# Patient Record
Sex: Male | Born: 1940 | Race: Black or African American | Hispanic: No | Marital: Single | Smoking: Former smoker
Health system: Southern US, Community
[De-identification: ages and names within clinical notes are randomized; demographics above are authoritative.]

## PROBLEM LIST (undated history)

## (undated) DIAGNOSIS — I2510 Atherosclerotic heart disease of native coronary artery without angina pectoris: Secondary | ICD-10-CM

## (undated) DIAGNOSIS — E119 Type 2 diabetes mellitus without complications: Secondary | ICD-10-CM

## (undated) DIAGNOSIS — I350 Nonrheumatic aortic (valve) stenosis: Secondary | ICD-10-CM

## (undated) DIAGNOSIS — I10 Essential (primary) hypertension: Secondary | ICD-10-CM

## (undated) HISTORY — DX: Essential (primary) hypertension: I10

## (undated) HISTORY — DX: Atherosclerotic heart disease of native coronary artery without angina pectoris: I25.10

## (undated) HISTORY — DX: Type 2 diabetes mellitus without complications: E11.9

## (undated) HISTORY — DX: Nonrheumatic aortic (valve) stenosis: I35.0

---

## 2000-07-01 ENCOUNTER — Emergency Department (HOSPITAL_COMMUNITY): Admission: EM | Admit: 2000-07-01 | Discharge: 2000-07-01 | Payer: Self-pay | Admitting: Emergency Medicine

## 2007-09-01 ENCOUNTER — Encounter: Admission: RE | Admit: 2007-09-01 | Discharge: 2007-09-01 | Payer: Self-pay | Admitting: Cardiovascular Disease

## 2009-09-18 ENCOUNTER — Emergency Department (HOSPITAL_COMMUNITY): Admission: EM | Admit: 2009-09-18 | Discharge: 2009-09-18 | Payer: Self-pay | Admitting: Family Medicine

## 2010-08-08 ENCOUNTER — Encounter (INDEPENDENT_AMBULATORY_CARE_PROVIDER_SITE_OTHER): Payer: Self-pay | Admitting: *Deleted

## 2011-01-29 NOTE — Letter (Signed)
Summary: Colonoscopy Date Change Letter  [facility]  [address] [place], [place] [address]   Phone: [phone]  Fax: [phone]      August 08, 2010 MRN: [id number]   Christopher Sawyer [address] [place], [place]  [address]   Dear [name],   Previously you were recommended to have a repeat colonoscopy around this time. Your chart was recently reviewed by Dr. Judie Petit T. Russella Dar of [facility]. Follow up colonoscopy is now recommended in September 2014. This revised recommendation is based on current, nationally recognized guidelines for colorectal cancer screening and polyp surveillance. These guidelines are endorsed by the American Cancer Society, [employer] on Colorectal Cancer as well as numerous other major medical organizations.  Please understand that our recommendation assumes that you do not have any new symptoms such as bleeding, a change in bowel habits, anemia, or significant abdominal discomfort. If you do have any concerning GI symptoms or want to discuss the guideline recommendations, please call to arrange an office visit at your earliest convenience. Otherwise we will keep you in our reminder system and contact you 1-2 months prior to the date listed above to schedule your next colonoscopy.  Thank you,  Judie Petit T. Russella Dar, M.D.  [facility] [phone]

## 2013-07-26 ENCOUNTER — Encounter: Payer: Self-pay | Admitting: Gastroenterology

## 2015-02-19 ENCOUNTER — Encounter: Payer: Self-pay | Admitting: Gastroenterology

## 2017-05-03 ENCOUNTER — Other Ambulatory Visit: Payer: Self-pay

## 2017-05-03 DIAGNOSIS — R0602 Shortness of breath: Secondary | ICD-10-CM

## 2017-05-07 ENCOUNTER — Other Ambulatory Visit: Payer: Self-pay

## 2017-05-07 ENCOUNTER — Ambulatory Visit (HOSPITAL_COMMUNITY): Payer: Medicare Other | Attending: Cardiology

## 2017-05-07 DIAGNOSIS — I350 Nonrheumatic aortic (valve) stenosis: Secondary | ICD-10-CM | POA: Diagnosis not present

## 2017-05-07 DIAGNOSIS — Z87898 Personal history of other specified conditions: Secondary | ICD-10-CM | POA: Diagnosis not present

## 2017-05-07 DIAGNOSIS — I10 Essential (primary) hypertension: Secondary | ICD-10-CM | POA: Insufficient documentation

## 2017-05-07 DIAGNOSIS — R0602 Shortness of breath: Secondary | ICD-10-CM

## 2017-05-17 ENCOUNTER — Ambulatory Visit (INDEPENDENT_AMBULATORY_CARE_PROVIDER_SITE_OTHER): Payer: Medicare Other | Admitting: Cardiovascular Disease

## 2017-05-17 ENCOUNTER — Encounter: Payer: Self-pay | Admitting: Cardiovascular Disease

## 2017-05-17 VITALS — BP 140/80 | HR 78 | Ht 67.5 in | Wt 207.8 lb

## 2017-05-17 DIAGNOSIS — I10 Essential (primary) hypertension: Secondary | ICD-10-CM | POA: Diagnosis not present

## 2017-05-17 DIAGNOSIS — I350 Nonrheumatic aortic (valve) stenosis: Secondary | ICD-10-CM | POA: Diagnosis not present

## 2017-05-17 DIAGNOSIS — E119 Type 2 diabetes mellitus without complications: Secondary | ICD-10-CM | POA: Insufficient documentation

## 2017-05-17 NOTE — Patient Instructions (Addendum)
Medication Instructions:  Your physician recommends that you continue on your current medications as directed. Please refer to the Current Medication list given to you today.  Labwork: No new orders.   Testing/Procedures: Your physician has requested that you have a GATED cardiac CT. Cardiac computed tomography (CT) is a painless test that uses an x-ray machine to take clear, detailed pictures of your heart. For further information please visit [email or web address].   Pre-procedure instructions: [facility] Entrance A on Friday, May 21, 2017 at 9:00 AM No solid foods, caffeine or smoking 4 hours prior to CT. No herbal supplements by mouth 24 hours prior to CT. No sexual enhancement drugs 72 hours prior to CT. Do NOT take Metformin the day before, morning of or 48 hours after CT.    Follow-Up: You have been referred to [facility] of Aortic Stenosis. Dr Orvan Julywen's office will contact you with an appointment.    Any Other Special Instructions Will Be Listed Below (If Applicable).     If you need a refill on your cardiac medications before your next appointment, please call your pharmacy.

## 2017-05-17 NOTE — Progress Notes (Signed)
Cardiology Office Note Date:  05/17/2017   ID:  Christopher PortsWilliam R Blando, DOB [birth year], MRN [id number]  PCP:  Orpah CobbKadakia, Ajay, MD  Cardiologist:  Tonny Bollmanooper, Baldo Hufnagle, MD    Chief Complaint  Patient presents with  . TAVR consult    History of Present Illness: Christopher Sawyer is a 76 y.o. male who presents for evaluation of severe aortic stenosis.   The patient has been followed by Dr Algie CofferKadakia. he's had a heart murmur for approximately 10 years based on his reported history to me. Over the last 6-12 months he has become progressively short of breath. He's been diagnosed with severe aortic stenosis. His evaluation has been through the [facility] system. At the time of today's visit, I don't have records available for review. The patient tells me had a heart catheterization at the [facility] last year. He's been told he needs aortic valve replacement, but he has declined conventional heart surgery. He presents today to see if he might be a candidate for TAVR.  The patient is here alone today. He reports progressive symptoms of shortness of breath with exertion and chest tightness associated with physical exertion. Symptoms occur predictably with walking and incline or at a brisk pace. Occasionally he has symptoms with lower level activity such as walking around the grocery store. Symptoms are relieved with rest. He denies lightheadedness or presyncope. He denies orthopnea or PND. The patient does complain of mild leg swelling. He has not had any resting symptoms. He has no history of lung disease. He smoked for a few years in his 4820s but not since then. 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   Past Medical History:  Diagnosis Date  . Hypertension   . Type 2 diabetes mellitus (HCC)     No past surgical history on file.  Current Outpatient Prescriptions  Medication Sig Dispense Refill  . amLODipine-benazepril (LOTREL) 10-40 MG capsule Take 1 capsule by mouth daily.    Marland Kitchen. aspirin 81  MG EC tablet Take 81 mg by mouth daily.    . metFORMIN (GLUCOPHAGE) 1000 MG tablet Take 1,000 mg by mouth daily.     No current facility-administered medications for this visit.     Allergies:   Penicillins   Social History:  The patient  reports that he has quit smoking. He has never used smokeless tobacco. Smoked for 2 years in his 3120's. Social alcohol.   Family History:  The patient's  family history includes Other in his father and mother.   ROS:  Please see the history of present illness.  Otherwise, review of systems is positive for Shortness of breath, leg swelling.  All other systems are reviewed and negative.   PHYSICAL EXAM: VS:  BP 140/80   Pulse 78   Ht 5' 7.5" (1.715 m)   Wt 207 lb 12.8 oz (94.3 kg)   BMI 32.07 kg/m  , BMI Body mass index is 32.07 kg/m. GEN: Well nourished, well developed, in no acute distress  HEENT: normal  Neck: no JVD, no masses. No carotid bruits Cardiac: RRR with grade 3/6 harsh late peaking systolic murmur with absent A2          Respiratory:  clear to auscultation bilaterally, normal work of breathing GI: soft, nontender, nondistended, + BS MS: no deformity or atrophy  Ext: 1+ pretibial edema, pedal pulses 2+= bilaterally Skin: warm and dry, no rash Neuro:  Strength and sensation are intact Psych: euthymic mood, full affect  EKG:  EKG is ordered today. The ekg ordered today shows normal sinus rhythm 78 bpm, PACs, otherwise within normal limits.  Recent Labs: No results found for requested labs within last 8760 hours.   Lipid Panel  No results found for: CHOL, TRIG, HDL, CHOLHDL, VLDL, LDLCALC, LDLDIRECT    Wt Readings from Last 3 Encounters:  05/17/17 207 lb 12.8 oz (94.3 kg)     Cardiac Studies Reviewed: 2D Echo 05-07-2017: Left ventricle:  The cavity size was normal. There was moderate concentric hypertrophy. Systolic function was normal. The estimated ejection fraction was in the range of 60% to 65%. Wall motion was normal;  there were no regional wall motion abnormalities. Doppler parameters are consistent with abnormal left ventricular relaxation (grade 1 diastolic dysfunction). Doppler parameters are consistent with high ventricular filling pressure.  ------------------------------------------------------------------- Aortic valve:   Trileaflet; severely thickened, severely calcified leaflets. Valve mobility was restricted.  Doppler:   There was severe stenosis.   There was trivial regurgitation.    VTI ratio of LVOT to aortic valve: 0.17. Valve area (VTI): 0.75 cm^2. Indexed valve area (VTI): 0.35 cm^2/m^2. Peak velocity ratio of LVOT to aortic valve: 0.15. Valve area (Vmax): 0.66 cm^2. Indexed valve area (Vmax): 0.31 cm^2/m^2. Mean velocity ratio of LVOT to aortic valve: 0.13. Valve area (Vmean): 0.59 cm^2. Indexed valve area (Vmean): 0.28 cm^2/m^2.    Mean gradient (S): 64 mm Hg. Peak gradient (S): 112 mm Hg.  ------------------------------------------------------------------- Aorta:  Aortic root: The aortic root was normal in size.  ------------------------------------------------------------------- Mitral valve:   Calcified annulus. Mobility was not restricted. Doppler:  Transvalvular velocity was within the normal range. There was no evidence for stenosis. There was trivial regurgitation. Peak gradient (D): 3 mm Hg.  ------------------------------------------------------------------- Left atrium:  The atrium was mildly dilated.  ------------------------------------------------------------------- Right ventricle:  The cavity size was mildly dilated. Wall thickness was normal. Systolic function was normal.  ------------------------------------------------------------------- Pulmonic valve:   Poorly visualized.  Structurally normal valve. Cusp separation was normal.  Doppler:  Transvalvular velocity was within the normal range. There was no evidence for stenosis. There was mild  regurgitation.  ------------------------------------------------------------------- Tricuspid valve:   Structurally normal valve.    Doppler: Transvalvular velocity was within the normal range. There was no regurgitation.  ------------------------------------------------------------------- Pulmonary artery:   The main pulmonary artery was normal-sized. Systolic pressure was within the normal range.  ------------------------------------------------------------------- Right atrium:  The atrium was normal in size.  ------------------------------------------------------------------- Pericardium:  There was no pericardial effusion.  ------------------------------------------------------------------- Systemic veins: Inferior vena cava: The vessel was normal in size.  ------------------------------------------------------------------- Measurements   Left ventricle                           Value          Reference  LV ID, ED, PLAX chordal          (L)     41    mm       43 - 52  LV ID, ES, PLAX chordal                  25.5  mm       23 - 38  LV fx shortening, PLAX chordal           38    %        >=29  LV PW thickness, ED  13.7  mm       ----------  IVS/LV PW ratio, ED                      1.22           <=1.3  Stroke volume, 2D                        93    ml       ----------  Stroke volume/bsa, 2D                    44    ml/m^2   ----------  LV e&', lateral                           4.57  cm/s     ----------  LV E/e&', lateral                         19.39          ----------  LV e&', medial                            4.24  cm/s     ----------  LV E/e&', medial                          20.9           ----------  LV e&', average                           4.41  cm/s     ----------  LV E/e&', average                         20.11          ----------    Ventricular septum                       Value          Reference  IVS thickness, ED                         16.7  mm       ----------    LVOT                                     Value          Reference  LVOT ID, S                               24    mm       ----------  LVOT area                                4.52  cm^2     ----------  LVOT peak velocity, S                    76.9  cm/s     ----------  LVOT mean velocity, S                    48.1  cm/s     ----------  LVOT VTI, S                              20.5  cm       ----------    Aortic valve                             Value          Reference  Aortic valve peak velocity, S            529   cm/s     ----------  Aortic valve mean velocity, S            370   cm/s     ----------  Aortic valve VTI, S                      123   cm       ----------  Aortic mean gradient, S                  64    mm Hg    ----------  Aortic peak gradient, S                  112   mm Hg    ----------  VTI ratio, LVOT/AV                       0.17           ----------  Aortic valve area, VTI                   0.75  cm^2     ----------  Aortic valve area/bsa, VTI               0.35  cm^2/m^2 ----------  Velocity ratio, peak, LVOT/AV            0.15           ----------  Aortic valve area, peak velocity         0.66  cm^2     ----------  Aortic valve area/bsa, peak              0.31  cm^2/m^2 ----------  velocity  Velocity ratio, mean, LVOT/AV            0.13           ----------  Aortic valve area, mean velocity         0.59  cm^2     ----------  Aortic valve area/bsa, mean              0.28  cm^2/m^2 ----------  velocity    Aorta                                    Value          Reference  Aortic root ID, ED                       38    mm       ----------  Ascending aorta ID, A-P, S               37    mm       ----------    Left atrium                              Value          Reference  LA ID, A-P, ES                           49    mm       ----------  LA ID/bsa, A-P                   (H)     2.32  cm/m^2   <=2.2  LA volume, S                              71.4  ml       ----------  LA volume/bsa, S                         33.8  ml/m^2   ----------  LA volume, ES, 1-p A4C                   64.7  ml       ----------  LA volume/bsa, ES, 1-p A4C               30.6  ml/m^2   ----------  LA volume, ES, 1-p A2C                   74    ml       ----------  LA volume/bsa, ES, 1-p A2C               35    ml/m^2   ----------    Mitral valve                             Value          Reference  Mitral E-wave peak velocity              88.6  cm/s     ----------  Mitral A-wave peak velocity              136   cm/s     ----------  Mitral deceleration time                 201   ms       150 - 230  Mitral peak gradient, D                  3     mm Hg    ----------  Mitral E/A ratio, peak                   0.7            ----------    Tricuspid valve                          Value          Reference  Tricuspid regurg peak velocity  236   cm/s     ----------  Tricuspid peak RV-RA gradient            22    mm Hg    ----------    Right atrium                             Value          Reference  RA ID, S-I, ES, A4C              (H)     51.8  mm       34 - 49  RA area, ES, A4C                         17.4  cm^2     8.3 - 19.5  RA volume, ES, A/L                       47.4  ml       ----------  RA volume/bsa, ES, A/L                   22.4  ml/m^2   ----------    Right ventricle                          Value          Reference  TAPSE                                    22.5  mm       ----------  RV s&', lateral, S                        25.3  cm/s     ----------  STS RIsk Calculator: RISK SCORES About the STS Risk Calculator Procedure: AV Replacement  Risk of Mortality: 1.828%  Morbidity or Mortality: 18.949%  Long Length of Stay: 7.214%  Short Length of Stay: 34.044%  Permanent Stroke: 1.575%  Prolonged Ventilation: 10.498%  DSW Infection: 0.276%  Renal Failure: 6.73%  Reoperation: 8.075%   ASSESSMENT AND PLAN: Severe, Stage D  aortic stenosis: NYHA II symptoms. I have reviewed the natural history of aortic stenosis with the patient today.   I have reviewed his recent echocardiogram which demonstrates severe calcification and restriction of the aortic valve leaflets. The patient has critical aortic stenosis with a mean transvalvular gradient over 60 and calculated valve area less than 0.7 cm. We have discussed the limitations of medical therapy and the poor prognosis associated with symptomatic aortic stenosis. We have reviewed potential treatment options, including palliative medical therapy, conventional surgical aortic valve replacement, and transcatheter aortic valve replacement. We discussed treatment options in the context of this patient's specific comorbid medical conditions. The patient's STS predicted risk mortality of less than 3% places him in a low risk category. I reviewed the fact that TAVR is currently only approved by the FDA for patients who are at moderate to high risk of mortality with  surgery. The patient's specific comorbid medical conditions include hypertension and type 2 diabetes treated with oral hypoglycemics. I do not have any specific details of the patient's previous cardiac catheterization but it appears that he has not had obstructive coronary  artery disease identified. Will send for copies of all of his records. The patient is adamant that he does not want to have heart surgery. I explained to him that it would be highly likely that he would do very well with surgery and have a low risk of serious complication. He understands that his predicted risk of mortality in the next 1-2 years is probably as high as 50% if he has nothing done to treat his aortic stenosis. Even understanding this, he states that he would probably opt for doing nothing over conventional surgery at this point. The patient is willing to undergo a gated cardiac CTA to evaluate for any other features that might place him at high risk  of heart surgery and make him a candidate for transcatheter aortic valve replacement. I will also explore whether there are any options for extended use through a low risk TAVR clinical trial. The patient will go for cardiac surgical evaluation which will be arranged after we have received all of his records and his gated cardiac scan of the heart is completed.  Current medicines are reviewed with the patient today.  The patient does not have concerns regarding medicines.  Labs/ tests ordered today include:  No orders of the defined types were placed in this encounter.  Disposition:   FU pending test results  Signed, Tonny Bollman, MD  05/17/2017 12:08 PM    [facility] [address] [place], [place], [place]  [address] Phone: [phone]; Fax: [phone]

## 2017-05-18 LAB — BASIC METABOLIC PANEL
BUN/Creatinine Ratio: 12 (ref 10–24)
BUN: 15 mg/dL (ref 8–27)
CALCIUM: 9.9 mg/dL (ref 8.6–10.2)
CHLORIDE: 106 mmol/L (ref 96–106)
CO2: 20 mmol/L (ref 18–29)
Creatinine, Ser: 1.28 mg/dL — ABNORMAL HIGH (ref 0.76–1.27)
GFR calc non Af Amer: 54 mL/min/{1.73_m2} — ABNORMAL LOW (ref >59)
GFR, EST AFRICAN AMERICAN: 63 mL/min/{1.73_m2} (ref >59)
Glucose: 215 mg/dL — ABNORMAL HIGH (ref 65–99)
POTASSIUM: 4.6 mmol/L (ref 3.5–5.2)
Sodium: 142 mmol/L (ref 134–144)

## 2017-05-21 ENCOUNTER — Ambulatory Visit (HOSPITAL_COMMUNITY)
Admission: RE | Admit: 2017-05-21 | Discharge: 2017-05-21 | Disposition: A | Discharge status: Home / Self Care | Payer: Medicare Other | Source: Ambulatory Visit | Attending: Cardiovascular Disease | Admitting: Cardiovascular Disease

## 2017-05-21 ENCOUNTER — Encounter (HOSPITAL_COMMUNITY): Payer: Self-pay

## 2017-05-21 DIAGNOSIS — I350 Nonrheumatic aortic (valve) stenosis: Secondary | ICD-10-CM

## 2017-05-21 DIAGNOSIS — I10 Essential (primary) hypertension: Secondary | ICD-10-CM | POA: Insufficient documentation

## 2017-05-21 MED ORDER — IOPAMIDOL (ISOVUE-370) INJECTION 76%
INTRAVENOUS | Status: AC
  Administered 2017-05-21: 80 mL
  Filled 2017-05-21: qty 100

## 2017-05-28 ENCOUNTER — Ambulatory Visit (HOSPITAL_COMMUNITY)
Admission: RE | Admit: 2017-05-28 | Discharge: 2017-05-28 | Disposition: A | Discharge status: Home / Self Care | Payer: Medicare Other | Source: Ambulatory Visit | Attending: Cardiovascular Disease | Admitting: Cardiovascular Disease

## 2017-05-28 ENCOUNTER — Telehealth: Payer: Self-pay | Admitting: Cardiovascular Disease

## 2017-05-28 ENCOUNTER — Encounter (HOSPITAL_COMMUNITY)
Admission: RE | Disposition: A | Discharge status: Home / Self Care | Payer: Self-pay | Source: Ambulatory Visit | Attending: Cardiovascular Disease

## 2017-05-28 DIAGNOSIS — I350 Nonrheumatic aortic (valve) stenosis: Secondary | ICD-10-CM | POA: Insufficient documentation

## 2017-05-28 DIAGNOSIS — Z7984 Long term (current) use of oral hypoglycemic drugs: Secondary | ICD-10-CM | POA: Insufficient documentation

## 2017-05-28 DIAGNOSIS — I10 Essential (primary) hypertension: Secondary | ICD-10-CM | POA: Insufficient documentation

## 2017-05-28 DIAGNOSIS — I2510 Atherosclerotic heart disease of native coronary artery without angina pectoris: Secondary | ICD-10-CM | POA: Insufficient documentation

## 2017-05-28 DIAGNOSIS — Z7982 Long term (current) use of aspirin: Secondary | ICD-10-CM | POA: Diagnosis not present

## 2017-05-28 DIAGNOSIS — E119 Type 2 diabetes mellitus without complications: Secondary | ICD-10-CM | POA: Diagnosis not present

## 2017-05-28 DIAGNOSIS — Z880 Allergy status to penicillin: Secondary | ICD-10-CM | POA: Insufficient documentation

## 2017-05-28 DIAGNOSIS — Z87891 Personal history of nicotine dependence: Secondary | ICD-10-CM | POA: Diagnosis not present

## 2017-05-28 DIAGNOSIS — I700 Atherosclerosis of aorta: Secondary | ICD-10-CM | POA: Insufficient documentation

## 2017-05-28 HISTORY — PX: AORTIC ARCH ANGIOGRAPHY: CATH118224

## 2017-05-28 LAB — GLUCOSE, CAPILLARY
Glucose-Capillary: 163 mg/dL — ABNORMAL HIGH (ref 65–99)
Glucose-Capillary: 111 mg/dL — ABNORMAL HIGH (ref 65–99)

## 2017-05-28 LAB — CBC
HCT: 34.8 % — ABNORMAL LOW (ref 39.0–52.0)
Hemoglobin: 11.7 g/dL — ABNORMAL LOW (ref 13.0–17.0)
MCH: 27.7 pg (ref 26.0–34.0)
MCHC: 33.6 g/dL (ref 30.0–36.0)
MCV: 82.3 fL (ref 78.0–100.0)
PLATELETS: 229 10*3/uL (ref 150–400)
RBC: 4.23 MIL/uL (ref 4.22–5.81)
RDW: 15.2 % (ref 11.5–15.5)
WBC: 7.2 10*3/uL (ref 4.0–10.5)

## 2017-05-28 LAB — POCT I-STAT 3, ART BLOOD GAS (G3+)
Acid-Base Excess: 2 mmol/L (ref 0.0–2.0)
Bicarbonate: 27.4 mmol/L (ref 20.0–28.0)
O2 SAT: 99 %
TCO2: 29 mmol/L (ref 0–100)
pCO2 arterial: 46.3 mmHg (ref 32.0–48.0)
pH, Arterial: 7.38 (ref 7.350–7.450)
pO2, Arterial: 119 mmHg — ABNORMAL HIGH (ref 83.0–108.0)

## 2017-05-28 LAB — POCT I-STAT 3, VENOUS BLOOD GAS (G3P V)
ACID-BASE EXCESS: 2 mmol/L (ref 0.0–2.0)
BICARBONATE: 28 mmol/L (ref 20.0–28.0)
O2 Saturation: 65 %
PO2 VEN: 36 mmHg (ref 32.0–45.0)
TCO2: 29 mmol/L (ref 0–100)
pCO2, Ven: 49 mmHg (ref 44.0–60.0)
pH, Ven: 7.365 (ref 7.250–7.430)

## 2017-05-28 LAB — PROTIME-INR
INR: 0.96
Prothrombin Time: 12.8 seconds (ref 11.4–15.2)

## 2017-05-28 SURGERY — RIGHT HEART CATH AND CORONARY ANGIOGRAPHY
Anesthesia: LOCAL

## 2017-05-28 MED ORDER — SODIUM CHLORIDE 0.9 % WEIGHT BASED INFUSION: 1.0000 mL/kg/h | INTRAVENOUS | Status: DC

## 2017-05-28 MED ORDER — SODIUM CHLORIDE 0.9% FLUSH: 3.0000 mL | Freq: Two times a day (BID) | INTRAVENOUS | Status: DC

## 2017-05-28 MED ORDER — ACETAMINOPHEN 325 MG PO TABS: 650.0000 mg | ORAL_TABLET | ORAL | Status: DC | PRN

## 2017-05-28 MED ORDER — FENTANYL CITRATE (PF) 100 MCG/2ML IJ SOLN
INTRAMUSCULAR | Status: DC | PRN
  Administered 2017-05-28: 25 ug via INTRAVENOUS

## 2017-05-28 MED ORDER — SODIUM CHLORIDE 0.9 % IV SOLN: 250.0000 mL | INTRAVENOUS | Status: DC | PRN

## 2017-05-28 MED ORDER — SODIUM CHLORIDE 0.9% FLUSH: 3.0000 mL | INTRAVENOUS | Status: DC | PRN

## 2017-05-28 MED ORDER — MIDAZOLAM HCL 2 MG/2ML IJ SOLN
INTRAMUSCULAR | Status: DC | PRN
Start: 2017-05-28 — End: 2017-05-28
  Administered 2017-05-28: 2 mg via INTRAVENOUS

## 2017-05-28 MED ORDER — ASPIRIN 81 MG PO CHEW: 81.0000 mg | CHEWABLE_TABLET | ORAL | Status: DC

## 2017-05-28 MED ORDER — HEPARIN (PORCINE) IN NACL 2-0.9 UNIT/ML-% IJ SOLN
INTRAMUSCULAR | Status: AC
  Filled 2017-05-28: qty 1000

## 2017-05-28 MED ORDER — HEPARIN SODIUM (PORCINE) 1000 UNIT/ML IJ SOLN
INTRAMUSCULAR | Status: DC | PRN
  Administered 2017-05-28: 5000 [IU] via INTRAVENOUS

## 2017-05-28 MED ORDER — HEPARIN (PORCINE) IN NACL 2-0.9 UNIT/ML-% IJ SOLN
INTRAMUSCULAR | Status: AC | PRN
  Administered 2017-05-28: 1000 mL

## 2017-05-28 MED ORDER — MIDAZOLAM HCL 2 MG/2ML IJ SOLN
INTRAMUSCULAR | Status: AC
  Filled 2017-05-28: qty 2

## 2017-05-28 MED ORDER — VERAPAMIL HCL 2.5 MG/ML IV SOLN
INTRAVENOUS | Status: DC | PRN
  Administered 2017-05-28: 10 mL via INTRA_ARTERIAL

## 2017-05-28 MED ORDER — LIDOCAINE HCL (PF) 1 % IJ SOLN
INTRAMUSCULAR | Status: AC
  Filled 2017-05-28: qty 30

## 2017-05-28 MED ORDER — ONDANSETRON HCL 4 MG/2ML IJ SOLN
4.0000 mg | Freq: Four times a day (QID) | INTRAMUSCULAR | Status: DC | PRN
Start: 2017-05-28 — End: 2017-05-28

## 2017-05-28 MED ORDER — IOPAMIDOL (ISOVUE-370) INJECTION 76%
INTRAVENOUS | Status: DC | PRN
  Administered 2017-05-28: 60 mL via INTRAVENOUS

## 2017-05-28 MED ORDER — SODIUM CHLORIDE 0.9 % WEIGHT BASED INFUSION
3.0000 mL/kg/h | INTRAVENOUS | Status: AC
  Administered 2017-05-28: 3 mL/kg/h via INTRAVENOUS

## 2017-05-28 MED ORDER — FENTANYL CITRATE (PF) 100 MCG/2ML IJ SOLN
INTRAMUSCULAR | Status: AC
  Filled 2017-05-28: qty 2

## 2017-05-28 MED ORDER — VERAPAMIL HCL 2.5 MG/ML IV SOLN
INTRAVENOUS | Status: AC
  Filled 2017-05-28: qty 2

## 2017-05-28 MED ORDER — HEPARIN SODIUM (PORCINE) 1000 UNIT/ML IJ SOLN
INTRAMUSCULAR | Status: AC
  Filled 2017-05-28: qty 1

## 2017-05-28 MED ORDER — LIDOCAINE HCL (PF) 1 % IJ SOLN
INTRAMUSCULAR | Status: DC | PRN
  Administered 2017-05-28: 1 mL
  Administered 2017-05-28: 3 mL

## 2017-05-28 SURGICAL SUPPLY — 12 items
CATH BALLN WEDGE 5F 110CM (CATHETERS) ×2 IMPLANT
CATH IMPULSE 5F ANG/FL3.5 (CATHETERS) ×2 IMPLANT
DEVICE RAD COMP TR BAND LRG (VASCULAR PRODUCTS) ×2 IMPLANT
GLIDESHEATH SLEND SS 6F .021 (SHEATH) ×2 IMPLANT
GUIDEWIRE INQWIRE 1.5J.035X260 (WIRE) ×1 IMPLANT
INQWIRE 1.5J .035X260CM (WIRE) ×2
KIT HEART LEFT (KITS) ×2 IMPLANT
PACK CARDIAC CATHETERIZATION (CUSTOM PROCEDURE TRAY) ×2 IMPLANT
SHEATH GLIDE SLENDER 4/5FR (SHEATH) ×2 IMPLANT
SYR MEDRAD MARK V 150ML (SYRINGE) ×2 IMPLANT
TRANSDUCER W/STOPCOCK (MISCELLANEOUS) ×2 IMPLANT
TUBING CIL FLEX 10 FLL-RA (TUBING) ×2 IMPLANT

## 2017-05-28 NOTE — Discharge Instructions (Signed)
NO METFORMIN/GLUCOPHAGE FOR 2 DAYS ° ° °Radial Site Care °Refer to this sheet in the next few weeks. These instructions provide you with information about caring for yourself after your procedure. Your health care provider may also give you more specific instructions. Your treatment has been planned according to current medical practices, but problems sometimes occur. Call your health care provider if you have any problems or questions after your procedure. °What can I expect after the procedure? °After your procedure, it is typical to have the following: °· Bruising at the radial site that usually fades within 1-2 weeks. °· Blood collecting in the tissue (hematoma) that may be painful to the touch. It should usually decrease in size and tenderness within 1-2 weeks. ° °Follow these instructions at home: °· Take medicines only as directed by your health care provider. °· You may shower 24-48 hours after the procedure or as directed by your health care provider. Remove the bandage (dressing) and gently wash the site with plain soap and water. Pat the area dry with a clean towel. Do not rub the site, because this may cause bleeding. °· Do not take baths, swim, or use a hot tub until your health care provider approves. °· Check your insertion site every day for redness, swelling, or drainage. °· Do not apply powder or lotion to the site. °· Do not flex or bend the affected arm for 24 hours or as directed by your health care provider. °· Do not push or pull heavy objects with the affected arm for 24 hours or as directed by your health care provider. °· Do not lift over 10 lb (4.5 kg) for 5 days after your procedure or as directed by your health care provider. °· Ask your health care provider when it is okay to: °? Return to work or school. °? Resume usual physical activities or sports. °? Resume sexual activity. °· Do not drive home if you are discharged the same day as the procedure. Have someone else drive you. °· You  may drive 24 hours after the procedure unless otherwise instructed by your health care provider. °· Do not operate machinery or power tools for 24 hours after the procedure. °· If your procedure was done as an outpatient procedure, which means that you went home the same day as your procedure, a responsible adult should be with you for the first 24 hours after you arrive home. °· Keep all follow-up visits as directed by your health care provider. This is important. °Contact a health care provider if: °· You have a fever. °· You have chills. °· You have increased bleeding from the radial site. Hold pressure on the site. °Get help right away if: °· You have unusual pain at the radial site. °· You have redness, warmth, or swelling at the radial site. °· You have drainage (other than a small amount of blood on the dressing) from the radial site. °· The radial site is bleeding, and the bleeding does not stop after 30 minutes of holding steady pressure on the site. °· Your arm or hand becomes pale, cool, tingly, or numb. °This information is not intended to replace advice given to you by your health care provider. Make sure you discuss any questions you have with your health care provider. °Document Released: 01/16/2011 Document Revised: 05/21/2016 Document Reviewed: 07/02/2014 °Elsevier Interactive Patient Education © 2018 [employer] ° °

## 2017-05-28 NOTE — Telephone Encounter (Signed)
New message    Pt is calling asking for a call back. He said he lost his paper work from visit and needs to know what to do.

## 2017-05-28 NOTE — Telephone Encounter (Signed)
Pt calling to confirm the address and location of where he should report too for his scheduled cath with Dr Excell Seltzerooper, today at 12:30 pm.   You are scheduled for a Cardiac Catheterization on Friday, June 1 with Dr. Tonny BollmanMichael Cooper.  1. [facility]: [address] [place], [place] [address] at 12:30 PM (two hours before your procedure to ensure your preparation). Free valet parking service is available.     Went over the directions with the pt as mentioned above.   Asked the pt if he followed all the other instructions on his cath sheet , and pt verbalized he did.   Pt gracious for all the assistance provided.

## 2017-05-28 NOTE — Interval H&P Note (Signed)
History and Physical Interval Note:  05/28/2017 4:16 PM  Christopher Sawyer  has presented today for surgery, with the diagnosis of cad, aortic stenosis  The various methods of treatment have been discussed with the patient and family. After consideration of risks, benefits and other options for treatment, the patient has consented to  Procedure(s): Right/Left Heart Cath and Coronary Angiography (N/A) as a surgical intervention .  The patient's history has been reviewed, patient examined, no change in status, stable for surgery.  I have reviewed the patient's chart and labs.  Questions were answered to the patient's satisfaction.     Tonny Bollmanooper, Modene Andy

## 2017-05-28 NOTE — H&P (View-Only) (Signed)
 Cardiology Office Note Date:  05/17/2017   ID:  Christopher Sawyer, DOB 01/24/1941, MRN [id number]  PCP:  Kadakia, Ajay, MD  Cardiologist:  Hildy Nicholl, MD    Chief Complaint  Patient presents with  . TAVR consult    History of Present Illness: Christopher Sawyer is a 76 y.o. male who presents for evaluation of severe aortic stenosis.   The patient has been followed by Dr Kadakia. he's had a heart murmur for approximately 10 years based on his reported history to me. Over the last 6-12 months he has become progressively short of breath. He's been diagnosed with severe aortic stenosis. [facility] At the time of today's visit, I don't have records available for review. The patient tells me [facility]. He's been told he needs aortic valve replacement, but he has declined conventional heart surgery. He presents today to see if he might be a candidate for TAVR.  The patient is here alone today. He reports progressive symptoms of shortness of breath with exertion and chest tightness associated with physical exertion. Symptoms occur predictably with walking and incline or at a brisk pace. Occasionally he has symptoms with lower level activity such as walking around the grocery store. Symptoms are relieved with rest. He denies lightheadedness or presyncope. He denies orthopnea or PND. The patient does complain of mild leg swelling. He has not had any resting symptoms. He has no history of lung disease. He smoked for a few years in his 20s but not since then. 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   Past Medical History:  Diagnosis Date  . Hypertension   . Type 2 diabetes mellitus (HCC)     No past surgical history on file.  Current Outpatient Prescriptions  Medication Sig Dispense Refill  . amLODipine-benazepril (LOTREL) 10-40 MG capsule Take 1 capsule by mouth daily.    . aspirin 81  MG EC tablet Take 81 mg by mouth daily.    . metFORMIN (GLUCOPHAGE) 1000 MG tablet Take 1,000 mg by mouth daily.     No current facility-administered medications for this visit.     Allergies:   Penicillins   Social History:  The patient  reports that he has quit smoking. He has never used smokeless tobacco. Smoked for 2 years in his 20's. Social alcohol.   Family History:  The patient's  family history includes Other in his father and mother.   ROS:  Please see the history of present illness.  Otherwise, review of systems is positive for Shortness of breath, leg swelling.  All other systems are reviewed and negative.   PHYSICAL EXAM: VS:  BP 140/80   Pulse 78   Ht 5' 7.5" (1.715 m)   Wt 207 lb 12.8 oz (94.3 kg)   BMI 32.07 kg/m  , BMI Body mass index is 32.07 kg/m. GEN: Well nourished, well developed, in no acute distress  HEENT: normal  Neck: no JVD, no masses. No carotid bruits Cardiac: RRR with grade 3/6 harsh late peaking systolic murmur with absent A2          Respiratory:  clear to auscultation bilaterally, normal work of breathing GI: soft, nontender, nondistended, + BS MS: no deformity or atrophy  Ext: 1+ pretibial edema, pedal pulses 2+= bilaterally Skin: warm and dry, no rash Neuro:  Strength and sensation are intact Psych: euthymic mood, full affect    EKG:  EKG is ordered today. The ekg ordered today shows normal sinus rhythm 78 bpm, PACs, otherwise within normal limits.  Recent Labs: No results found for requested labs within last 8760 hours.   Lipid Panel  No results found for: CHOL, TRIG, HDL, CHOLHDL, VLDL, LDLCALC, LDLDIRECT    Wt Readings from Last 3 Encounters:  05/17/17 207 lb 12.8 oz (94.3 kg)     Cardiac Studies Reviewed: 2D Echo 05-07-2017: Left ventricle:  The cavity size was normal. There was moderate concentric hypertrophy. Systolic function was normal. The estimated ejection fraction was in the range of 60% to 65%. Wall motion was normal;  there were no regional wall motion abnormalities. Doppler parameters are consistent with abnormal left ventricular relaxation (grade 1 diastolic dysfunction). Doppler parameters are consistent with high ventricular filling pressure.  ------------------------------------------------------------------- Aortic valve:   Trileaflet; severely thickened, severely calcified leaflets. Valve mobility was restricted.  Doppler:   There was severe stenosis.   There was trivial regurgitation.    VTI ratio of LVOT to aortic valve: 0.17. Valve area (VTI): 0.75 cm^2. Indexed valve area (VTI): 0.35 cm^2/m^2. Peak velocity ratio of LVOT to aortic valve: 0.15. Valve area (Vmax): 0.66 cm^2. Indexed valve area (Vmax): 0.31 cm^2/m^2. Mean velocity ratio of LVOT to aortic valve: 0.13. Valve area (Vmean): 0.59 cm^2. Indexed valve area (Vmean): 0.28 cm^2/m^2.    Mean gradient (S): 64 mm Hg. Peak gradient (S): 112 mm Hg.  ------------------------------------------------------------------- Aorta:  Aortic root: The aortic root was normal in size.  ------------------------------------------------------------------- Mitral valve:   Calcified annulus. Mobility was not restricted. Doppler:  Transvalvular velocity was within the normal range. There was no evidence for stenosis. There was trivial regurgitation. Peak gradient (D): 3 mm Hg.  ------------------------------------------------------------------- Left atrium:  The atrium was mildly dilated.  ------------------------------------------------------------------- Right ventricle:  The cavity size was mildly dilated. Wall thickness was normal. Systolic function was normal.  ------------------------------------------------------------------- Pulmonic valve:   Poorly visualized.  Structurally normal valve. Cusp separation was normal.  Doppler:  Transvalvular velocity was within the normal range. There was no evidence for stenosis. There was mild  regurgitation.  ------------------------------------------------------------------- Tricuspid valve:   Structurally normal valve.    Doppler: Transvalvular velocity was within the normal range. There was no regurgitation.  ------------------------------------------------------------------- Pulmonary artery:   The main pulmonary artery was normal-sized. Systolic pressure was within the normal range.  ------------------------------------------------------------------- Right atrium:  The atrium was normal in size.  ------------------------------------------------------------------- Pericardium:  There was no pericardial effusion.  ------------------------------------------------------------------- Systemic veins: Inferior vena cava: The vessel was normal in size.  ------------------------------------------------------------------- Measurements   Left ventricle                           Value          Reference  LV ID, ED, PLAX chordal          (L)     41    mm       43 - 52  LV ID, ES, PLAX chordal                  25.5  mm       23 - 38  LV fx shortening, PLAX chordal           38    %        >=29  LV PW thickness, ED                        13.7  mm       ----------  IVS/LV PW ratio, ED                      1.22           <=1.3  Stroke volume, 2D                        93    ml       ----------  Stroke volume/bsa, 2D                    44    ml/m^2   ----------  LV e&', lateral                           4.57  cm/s     ----------  LV E/e&', lateral                         19.39          ----------  LV e&', medial                            4.24  cm/s     ----------  LV E/e&', medial                          20.9           ----------  LV e&', average                           4.41  cm/s     ----------  LV E/e&', average                         20.11          ----------    Ventricular septum                       Value          Reference  IVS thickness, ED                         16.7  mm       ----------    LVOT                                     Value          Reference  LVOT ID, S                               24    mm       ----------  LVOT area                                4.52  cm^2     ----------  LVOT peak velocity, S                    76.9  cm/s     ----------    LVOT mean velocity, S                    48.1  cm/s     ----------  LVOT VTI, S                              20.5  cm       ----------    Aortic valve                             Value          Reference  Aortic valve peak velocity, S            529   cm/s     ----------  Aortic valve mean velocity, S            370   cm/s     ----------  Aortic valve VTI, S                      123   cm       ----------  Aortic mean gradient, S                  64    mm Hg    ----------  Aortic peak gradient, S                  112   mm Hg    ----------  VTI ratio, LVOT/AV                       0.17           ----------  Aortic valve area, VTI                   0.75  cm^2     ----------  Aortic valve area/bsa, VTI               0.35  cm^2/m^2 ----------  Velocity ratio, peak, LVOT/AV            0.15           ----------  Aortic valve area, peak velocity         0.66  cm^2     ----------  Aortic valve area/bsa, peak              0.31  cm^2/m^2 ----------  velocity  Velocity ratio, mean, LVOT/AV            0.13           ----------  Aortic valve area, mean velocity         0.59  cm^2     ----------  Aortic valve area/bsa, mean              0.28  cm^2/m^2 ----------  velocity    Aorta                                    Value          Reference  Aortic root ID, ED                       38    mm       ----------    Ascending aorta ID, A-P, S               37    mm       ----------    Left atrium                              Value          Reference  LA ID, A-P, ES                           49    mm       ----------  LA ID/bsa, A-P                   (H)     2.32  cm/m^2   <=2.2  LA volume, S                              71.4  ml       ----------  LA volume/bsa, S                         33.8  ml/m^2   ----------  LA volume, ES, 1-p A4C                   64.7  ml       ----------  LA volume/bsa, ES, 1-p A4C               30.6  ml/m^2   ----------  LA volume, ES, 1-p A2C                   74    ml       ----------  LA volume/bsa, ES, 1-p A2C               35    ml/m^2   ----------    Mitral valve                             Value          Reference  Mitral E-wave peak velocity              88.6  cm/s     ----------  Mitral A-wave peak velocity              136   cm/s     ----------  Mitral deceleration time                 201   ms       150 - 230  Mitral peak gradient, D                  3     mm Hg    ----------  Mitral E/A ratio, peak                   0.7            ----------    Tricuspid valve                          Value          Reference  Tricuspid regurg peak velocity             236   cm/s     ----------  Tricuspid peak RV-RA gradient            22    mm Hg    ----------    Right atrium                             Value          Reference  RA ID, S-I, ES, A4C              (H)     51.8  mm       34 - 49  RA area, ES, A4C                         17.4  cm^2     8.3 - 19.5  RA volume, ES, A/L                       47.4  ml       ----------  RA volume/bsa, ES, A/L                   22.4  ml/m^2   ----------    Right ventricle                          Value          Reference  TAPSE                                    22.5  mm       ----------  RV s&', lateral, S                        25.3  cm/s     ----------  STS RIsk Calculator: RISK SCORES About the STS Risk Calculator Procedure: AV Replacement  Risk of Mortality: 1.828%  Morbidity or Mortality: 18.949%  Long Length of Stay: 7.214%  Short Length of Stay: 34.044%  Permanent Stroke: 1.575%  Prolonged Ventilation: 10.498%  DSW Infection: 0.276%  Renal Failure: 6.73%  Reoperation: 8.075%   ASSESSMENT AND PLAN: Severe, Stage D  aortic stenosis: NYHA II symptoms. I have reviewed the natural history of aortic stenosis with the patient today.   I have reviewed his recent echocardiogram which demonstrates severe calcification and restriction of the aortic valve leaflets. The patient has critical aortic stenosis with a mean transvalvular gradient over 60 and calculated valve area less than 0.7 cm. We have discussed the limitations of medical therapy and the poor prognosis associated with symptomatic aortic stenosis. We have reviewed potential treatment options, including palliative medical therapy, conventional surgical aortic valve replacement, and transcatheter aortic valve replacement. We discussed treatment options in the context of this patient's specific comorbid medical conditions. The patient's STS predicted risk mortality of less than 3% places him in a low risk category. I reviewed the fact that TAVR is currently only approved by the FDA for patients who are at moderate to high risk of mortality with  surgery. The patient's specific comorbid medical conditions include hypertension and type 2 diabetes treated with oral hypoglycemics. I do not have any specific details of the patient's previous cardiac catheterization but it appears that he has not had obstructive coronary   artery disease identified. Will send for copies of all of his records. The patient is adamant that he does not want to have heart surgery. I explained to him that it would be highly likely that he would do very well with surgery and have a low risk of serious complication. He understands that his predicted risk of mortality in the next 1-2 years is probably as high as 50% if he has nothing done to treat his aortic stenosis. Even understanding this, he states that he would probably opt for doing nothing over conventional surgery at this point. The patient is willing to undergo a gated cardiac CTA to evaluate for any other features that might place him at high risk  of heart surgery and make him a candidate for transcatheter aortic valve replacement. I will also explore whether there are any options for extended use through a low risk TAVR clinical trial. The patient will go for cardiac surgical evaluation which will be arranged after we have received all of his records and his gated cardiac scan of the heart is completed.  Current medicines are reviewed with the patient today.  The patient does not have concerns regarding medicines.  Labs/ tests ordered today include:  No orders of the defined types were placed in this encounter.  Disposition:   FU pending test results  Signed, Kairav Russomanno, MD  05/17/2017 12:08 PM    [facility] [address], [place], [place]  [address] Phone: [phone]; Fax: [phone]  

## 2017-05-31 ENCOUNTER — Telehealth: Payer: Self-pay | Admitting: Cardiovascular Disease

## 2017-05-31 ENCOUNTER — Encounter (HOSPITAL_COMMUNITY): Payer: Self-pay | Admitting: Cardiovascular Disease

## 2017-05-31 NOTE — Telephone Encounter (Signed)
Returned call to patient-patient requesting results from Echo and results from cath on Friday.  Advised I would get medical records to assist.    Patient aware and verbalized understanding.

## 2017-05-31 NOTE — Telephone Encounter (Signed)
Patient returning your call, thanks. °

## 2017-05-31 NOTE — Telephone Encounter (Signed)
Spoke with patient he will come by Tuesday sign release and pick up copy of Cath & Echo.

## 2017-05-31 NOTE — Telephone Encounter (Signed)
New message  Pt call requesting to speak with RN. Pt hung up while reaching out to RN. Please call back to discuss

## 2017-05-31 NOTE — Telephone Encounter (Signed)
Left message on machine for pt to contact the office.   

## 2017-06-02 ENCOUNTER — Encounter: Payer: Self-pay | Admitting: Thoracic Surgery (Cardiothoracic Vascular Surgery)

## 2017-06-02 ENCOUNTER — Institutional Professional Consult (permissible substitution) (INDEPENDENT_AMBULATORY_CARE_PROVIDER_SITE_OTHER): Payer: Medicare Other | Admitting: Thoracic Surgery (Cardiothoracic Vascular Surgery)

## 2017-06-02 VITALS — BP 119/75 | HR 85 | Resp 16 | Ht 67.5 in | Wt 207.0 lb

## 2017-06-02 DIAGNOSIS — I2510 Atherosclerotic heart disease of native coronary artery without angina pectoris: Secondary | ICD-10-CM

## 2017-06-02 DIAGNOSIS — I350 Nonrheumatic aortic (valve) stenosis: Secondary | ICD-10-CM | POA: Diagnosis not present

## 2017-06-02 NOTE — Patient Instructions (Addendum)

## 2017-06-02 NOTE — Progress Notes (Signed)
[facility]  MULTIDISCIPLINARY HEART VALVE CLINIC  CARDIOTHORACIC SURGERY CONSULTATION REPORT  Referring Provider is Tonny Bollman, MD PCP is Orpah Cobb, MD  Chief Complaint  Patient presents with  . Aortic Stenosis    eval for CABG/AVR vs TAVR...ECHO 05/07/17, CATH 05/28/17, CT HEART 05/21/17    HPI:  Patient is a 76 year old African-American male with history of aortic stenosis, hypertension, and type 2 diabetes mellitus who has been referred for surgical consultation to discuss treatment options for management of severe symptomatic aortic stenosis and multivessel coronary artery disease. The patient states that he was noted to have a heart murmur on physical exam at least 10 or 12 years ago. He has been followed through the [facility] where he was diagnosed with aortic stenosis. Approximately 2 years ago he began to experience symptoms of exertional shortness of breath. In early 2017 he underwent echocardiogram and diagnostic cardiac catheterization at the [facility] He was told that he had aortic stenosis and coronary artery disease, and surgical intervention was recommended. The patient declined. Over the past year the patient states that symptoms of exertional shortness of breath have progressed somewhat. He denies any symptoms of resting shortness of breath, PND, orthopnea, palpitations, syncope, or dizzy spells. He only gets short of breath with exertion, but currently takes much less than it had in the past. He is to walk on a regular basis, and now he states that he sometimes gets tired and short of breath just walking through the grocery store. 2 weeks ago he had a brief episode where he got short of breath and felt very poorly and had to sit down for several minutes. He has never had any chest pain or chest tightness. He was referred to Dr. Excell Seltzer for consultation and underwent transthoracic echocardiogram on 05/07/2017. Peak velocity across the  aortic valve measured 5.3 m/s corresponding to mean transvalvular gradient estimated 64 mmHg. Left ventricular systolic function remained normal with ejection fraction estimated 60-65%.  Left and right heart catheterization was performed 05/28/2017. The patient was found to have severe 2 vessel coronary artery disease involving the right coronary artery in the left circumflex, with high-grade 90% stenosis of the proximal large first OM branch and 90% stenosis of mid left circumflex with 60% stenosis of the proximal right coronary artery and 75% stenosis of mid right coronary artery. There was nonobstructive disease in the left anterior descending coronary artery. Right heart pressures were normal. The patient was referred for surgical consultation.  The patient is single and lives locally in [place]. He has 5 adult children, but none of them lives nearby. He has been retired for approximately 20 years, having previously worked for the [employer]. He has remained reasonably active and functionally independent throughout retirement. He reports no significant physical limitations other than his problems with exertional shortness of breath. Symptoms have progressed as noted previously, but he denies any symptoms of resting shortness of breath. Mobility is good. He has no other significant physical limitations.  Past Medical History:  Diagnosis Date  . Coronary artery disease involving native coronary artery of native heart without angina pectoris   . Hypertension   . Severe aortic stenosis   . Type 2 diabetes mellitus (HCC)     Past Surgical History:  Procedure Laterality Date  . AORTIC ARCH ANGIOGRAPHY N/A 05/28/2017   Procedure: Aortic Arch Angiography;  Surgeon: Tonny Bollman, MD;  Location: [facility] INVASIVE CV LAB;  Service: Cardiovascular;  Laterality: N/A;  Family History  Problem Relation Age of Onset  . Other Mother        Heart valve replacement  . Other Father        Pacemaker     Social History   Social History  . Marital status: Single    Spouse name: N/A  . Number of children: N/A  . Years of education: N/A   Occupational History  . Not on file.   Social History Main Topics  . Smoking status: Former Games developermoker  . Smokeless tobacco: Never Used  . Alcohol use Not on file  . Drug use: Unknown  . Sexual activity: Not on file   Other Topics Concern  . Not on file   Social History Narrative  . No narrative on file    Current Outpatient Prescriptions  Medication Sig Dispense Refill  . amLODipine-benazepril (LOTREL) 10-40 MG capsule Take 1 capsule by mouth daily.    Marland Kitchen. aspirin 81 MG EC tablet Take 81 mg by mouth daily.    . metFORMIN (GLUCOPHAGE) 1000 MG tablet Take 1,000 mg by mouth 2 (two) times daily.     . Multiple Vitamin (MULTIVITAMIN WITH MINERALS) TABS tablet Take 1 tablet by mouth daily.     No current facility-administered medications for this visit.     Allergies  Allergen Reactions  . Penicillins Hives    Has patient had a PCN reaction causing immediate rash, facial/tongue/throat swelling, SOB or lightheadedness with hypotension: Yes Has patient had a PCN reaction causing severe rash involving mucus membranes or skin necrosis: No Has patient had a PCN reaction that required hospitalization: No Has patient had a PCN reaction occurring within the last 10 years: No If all of the above answers are "NO", then may proceed with Cephalosporin use.      Review of Systems:   General:  normal appetite, normal energy, no weight gain, no weight loss, no fever  Cardiac:  no chest pain with exertion, no chest pain at rest, + SOB with exertion, no resting SOB, no PND, no orthopnea, no palpitations, no arrhythmia, no atrial fibrillation, mild LE edema, no dizzy spells, no syncope  Respiratory:  + exertional shortness of breath, no home oxygen, no productive cough, no dry cough, no bronchitis, no wheezing, no hemoptysis, no asthma, no pain with  inspiration or cough, no sleep apnea, no CPAP at night  GI:   no difficulty swallowing, no reflux, no frequent heartburn, no hiatal hernia, no abdominal pain, no constipation, no diarrhea, no hematochezia, no hematemesis, no melena  GU:   no dysuria,  + frequency, no urinary tract infection, no hematuria, no enlarged prostate, no kidney stones, no kidney disease  Vascular:  no pain suggestive of claudication, no pain in feet, no leg cramps, no varicose veins, no DVT, no non-healing foot ulcer  Neuro:   no stroke, no TIA's, no seizures, no headaches, no temporary blindness one eye,  no slurred speech, no peripheral neuropathy, no chronic pain, no instability of gait, no memory/cognitive dysfunction  Musculoskeletal: no arthritis, no joint swelling, no myalgias, no difficulty walking, normal mobility   Skin:   no rash, no itching, no skin infections, no pressure sores or ulcerations  Psych:   no anxiety, no depression, no nervousness, no unusual recent stress  Eyes:   no blurry vision, no floaters, no recent vision changes, + wears only for reading  ENT:   no hearing loss, no loose or painful teeth, no dentures, last saw dentist > 2 years  ago  Hematologic:  no easy bruising, no abnormal bleeding, no clotting disorder, no frequent epistaxis  Endocrine:  + diabetes, does check CBG's at home           Physical Exam:   BP 119/75 (BP Location: Left Arm, Patient Position: Sitting, Cuff Size: Large)   Pulse 85   Resp 16   Ht 5' 7.5" (1.715 m)   Wt 207 lb (93.9 kg)   SpO2 96% Comment: ON RA  BMI 31.94 kg/m   General:  Mildly obese,  well-appearing  HEENT:  Unremarkable   Neck:   no JVD, no bruits, no adenopathy   Chest:   clear to auscultation, symmetrical breath sounds, no wheezes, no rhonchi   CV:   RRR, grade IV/VI crescendo/decrescendo murmur heard best at RUSB,  no diastolic murmur  Abdomen:  soft, non-tender, no masses   Extremities:  warm, well-perfused, pulses palpable, no LE  edema  Rectal/GU  Deferred  Neuro:   Grossly non-focal and symmetrical throughout  Skin:   Clean and dry, no rashes, no breakdown   Diagnostic Tests:  Transthoracic Echocardiography  Patient:    Christopher Sawyer, Christopher Sawyer MR #:       [id number] Study Date: 05/07/2017 Gender:     M Age:        76 Height:     172.7 cm Weight:     90.9 kg BSA:        2.11 m^2 Pt. Status: Room:   ORDERING     Tonny Bollman, MD  REFERRING    Tonny Bollman, MD  ATTENDING    Donato Schultz, M.D.  PERFORMING   [facility], Outpatient  SONOGRAPHER  [facility], RDCS  cc:  ------------------------------------------------------------------- LV EF: 60% -   65%  ------------------------------------------------------------------- Indications:      Shortness of Breath (R06.02).  ------------------------------------------------------------------- History:   PMH:   Aortic valve disease.  Risk factors:  Severe Aortic Stenosis (pre-operative Evaluation for TAVR candidacy?) Former tobacco use. Hypertension.  ------------------------------------------------------------------- Study Conclusions  - Left ventricle: The cavity size was normal. There was moderate   concentric hypertrophy. Systolic function was normal. The   estimated ejection fraction was in the range of 60% to 65%. Wall   motion was normal; there were no regional wall motion   abnormalities. Doppler parameters are consistent with abnormal   left ventricular relaxation (grade 1 diastolic dysfunction).   Doppler parameters are consistent with high ventricular filling   pressure. - Aortic valve: Valve mobility was restricted. There was severe   stenosis. There was trivial regurgitation. Peak velocity (S): 529   cm/s. Mean gradient (S): 64 mm Hg. Valve area (VTI): 0.75 cm^2.   Valve area (Vmax): 0.66 cm^2. Valve area (Vmean): 0.59 cm^2. - Mitral valve: Calcified annulus. There was trivial regurgitation. - Left atrium: The atrium was mildly  dilated. - Right ventricle: The cavity size was mildly dilated. Wall   thickness was normal.  ------------------------------------------------------------------- Study data:  No prior study was available for comparison.  Study status:  Routine.  Procedure:  Transthoracic echocardiography. Image quality was adequate.          Transthoracic echocardiography.  M-mode, complete 2D, spectral Doppler, and color Doppler.  Birthdate:  Patient birthdate: 1941-11-05.  Age:  Patient is 76 yr old.  Sex:  Gender: male.    BMI: 30.5 kg/m^2.  Blood pressure:     148/92  Patient status:  Outpatient.  Study date: Study date: 05/07/2017. Study time: 03:29 PM.  Location:  [facility]  -------------------------------------------------------------------  ------------------------------------------------------------------- Left ventricle:  The cavity size was normal. There was moderate concentric hypertrophy. Systolic function was normal. The estimated ejection fraction was in the range of 60% to 65%. Wall motion was normal; there were no regional wall motion abnormalities. Doppler parameters are consistent with abnormal left ventricular relaxation (grade 1 diastolic dysfunction). Doppler parameters are consistent with high ventricular filling pressure.  ------------------------------------------------------------------- Aortic valve:   Trileaflet; severely thickened, severely calcified leaflets. Valve mobility was restricted.  Doppler:   There was severe stenosis.   There was trivial regurgitation.    VTI ratio of LVOT to aortic valve: 0.17. Valve area (VTI): 0.75 cm^2. Indexed valve area (VTI): 0.35 cm^2/m^2. Peak velocity ratio of LVOT to aortic valve: 0.15. Valve area (Vmax): 0.66 cm^2. Indexed valve area (Vmax): 0.31 cm^2/m^2. Mean velocity ratio of LVOT to aortic valve: 0.13. Valve area (Vmean): 0.59 cm^2. Indexed valve area (Vmean): 0.28 cm^2/m^2.    Mean gradient (S): 64 mm Hg.  Peak gradient (S): 112 mm Hg.  ------------------------------------------------------------------- Aorta:  Aortic root: The aortic root was normal in size.  ------------------------------------------------------------------- Mitral valve:   Calcified annulus. Mobility was not restricted. Doppler:  Transvalvular velocity was within the normal range. There was no evidence for stenosis. There was trivial regurgitation. Peak gradient (D): 3 mm Hg.  ------------------------------------------------------------------- Left atrium:  The atrium was mildly dilated.  ------------------------------------------------------------------- Right ventricle:  The cavity size was mildly dilated. Wall thickness was normal. Systolic function was normal.  ------------------------------------------------------------------- Pulmonic valve:   Poorly visualized.  Structurally normal valve. Cusp separation was normal.  Doppler:  Transvalvular velocity was within the normal range. There was no evidence for stenosis. There was mild regurgitation.  ------------------------------------------------------------------- Tricuspid valve:   Structurally normal valve.    Doppler: Transvalvular velocity was within the normal range. There was no regurgitation.  ------------------------------------------------------------------- Pulmonary artery:   The main pulmonary artery was normal-sized. Systolic pressure was within the normal range.  ------------------------------------------------------------------- Right atrium:  The atrium was normal in size.  ------------------------------------------------------------------- Pericardium:  There was no pericardial effusion.  ------------------------------------------------------------------- Systemic veins: Inferior vena cava: The vessel was normal in size.  ------------------------------------------------------------------- Measurements   Left ventricle                            Value          Reference  LV ID, ED, PLAX chordal          (L)     41    mm       43 - 52  LV ID, ES, PLAX chordal                  25.5  mm       23 - 38  LV fx shortening, PLAX chordal           38    %        >=29  LV PW thickness, ED                      13.7  mm       ----------  IVS/LV PW ratio, ED                      1.22           <=1.3  Stroke volume, 2D  93    ml       ----------  Stroke volume/bsa, 2D                    44    ml/m^2   ----------  LV e&', lateral                           4.57  cm/s     ----------  LV E/e&', lateral                         19.39          ----------  LV e&', medial                            4.24  cm/s     ----------  LV E/e&', medial                          20.9           ----------  LV e&', average                           4.41  cm/s     ----------  LV E/e&', average                         20.11          ----------    Ventricular septum                       Value          Reference  IVS thickness, ED                        16.7  mm       ----------    LVOT                                     Value          Reference  LVOT ID, S                               24    mm       ----------  LVOT area                                4.52  cm^2     ----------  LVOT peak velocity, S                    76.9  cm/s     ----------  LVOT mean velocity, S                    48.1  cm/s     ----------  LVOT VTI, S                              20.5  cm       ----------  Aortic valve                             Value          Reference  Aortic valve peak velocity, S            529   cm/s     ----------  Aortic valve mean velocity, S            370   cm/s     ----------  Aortic valve VTI, S                      123   cm       ----------  Aortic mean gradient, S                  64    mm Hg    ----------  Aortic peak gradient, S                  112   mm Hg    ----------  VTI ratio, LVOT/AV                        0.17           ----------  Aortic valve area, VTI                   0.75  cm^2     ----------  Aortic valve area/bsa, VTI               0.35  cm^2/m^2 ----------  Velocity ratio, peak, LVOT/AV            0.15           ----------  Aortic valve area, peak velocity         0.66  cm^2     ----------  Aortic valve area/bsa, peak              0.31  cm^2/m^2 ----------  velocity  Velocity ratio, mean, LVOT/AV            0.13           ----------  Aortic valve area, mean velocity         0.59  cm^2     ----------  Aortic valve area/bsa, mean              0.28  cm^2/m^2 ----------  velocity    Aorta                                    Value          Reference  Aortic root ID, ED                       38    mm       ----------  Ascending aorta ID, A-P, S               37    mm       ----------    Left atrium                              Value          Reference  LA ID, A-P, ES                           49    mm       ----------  LA ID/bsa, A-P                   (H)     2.32  cm/m^2   <=2.2  LA volume, S                             71.4  ml       ----------  LA volume/bsa, S                         33.8  ml/m^2   ----------  LA volume, ES, 1-p A4C                   64.7  ml       ----------  LA volume/bsa, ES, 1-p A4C               30.6  ml/m^2   ----------  LA volume, ES, 1-p A2C                   74    ml       ----------  LA volume/bsa, ES, 1-p A2C               35    ml/m^2   ----------    Mitral valve                             Value          Reference  Mitral E-wave peak velocity              88.6  cm/s     ----------  Mitral A-wave peak velocity              136   cm/s     ----------  Mitral deceleration time                 201   ms       150 - 230  Mitral peak gradient, D                  3     mm Hg    ----------  Mitral E/A ratio, peak                   0.7            ----------    Tricuspid valve                          Value          Reference  Tricuspid regurg peak velocity            236   cm/s     ----------  Tricuspid peak RV-RA gradient            22    mm Hg    ----------    Right atrium                             Value  Reference  RA ID, S-I, ES, A4C              (H)     51.8  mm       34 - 49  RA area, ES, A4C                         17.4  cm^2     8.3 - 19.5  RA volume, ES, A/L                       47.4  ml       ----------  RA volume/bsa, ES, A/L                   22.4  ml/m^2   ----------    Right ventricle                          Value          Reference  TAPSE                                    22.5  mm       ----------  RV s&', lateral, S                        25.3  cm/s     ----------  Legend: (L)  and  (H)  mark values outside specified reference range.  ------------------------------------------------------------------- Prepared and Electronically Authenticated by  Donato Schultz, M.D. 2018-05-11T16:42:44   Aortic Arch Angiography  Right Heart Cath and Coronary Angiography  Conclusion   1. Known severe calcific aortic stenosis by noninvasive assessment 2. Severe two-vessel coronary artery disease involving the right coronary artery and left circumflex 3. Patent LAD and left main 4. Normal right heart hemodynamics.  Recommendations: Cardiac surgical evaluation for further discussion of treatment options for severe aortic stenosis and two-vessel coronary artery disease in this patient with exertional dyspnea and angina.  Indications   Severe aortic stenosis [I35.0 (ICD-10-CM)]  Procedural Details/Technique   Technical Details INDICATION: Severe symptomatic aortic stenosis - preoperative study  PROCEDURAL DETAILS: There was an indwelling IV in a right antecubital vein. Using normal sterile technique, the IV was changed out for a 5 Fr brachial sheath over a 0.018 inch wire. The right wrist was then prepped, draped, and anesthetized with 1% lidocaine. Using the modified Seldinger technique a 5/6 French Slender sheath was placed  in the right radial artery. Intra-arterial verapamil was administered through the radial artery sheath. IV heparin was administered after a JR4 catheter was advanced into the central aorta. A Swan-Ganz catheter was used for the right heart catheterization. Standard protocol was followed for recording of right heart pressures and sampling of oxygen saturations. Fick cardiac output was calculated. Standard Judkins catheters were used for selective coronary angiography and aortic root angiography. There were no immediate procedural complications. The patient was transferred to the post catheterization recovery area for further monitoring.     Estimated blood loss <50 mL.  During this procedure the patient was administered the following to achieve and maintain moderate conscious sedation: Versed 2 mg, Fentanyl 25 mcg, while the patient's heart rate, blood pressure, and oxygen saturation were continuously monitored. The period of conscious sedation was 30 minutes, of which I was present  face-to-face 100% of this time.    Coronary Findings   Dominance: Right  Left Main  Vessel is angiographically normal.  Left Anterior Descending  There is mild the vessel.  First Diagonal Branch  There is mild disease in the vessel.  Second Diagonal Branch  There is mild disease in the vessel.  Left Circumflex  Mid Cx lesion, 90% stenosed. The lesion is eccentric.  First Obtuse Marginal Branch  1st Mrg lesion, 90% stenosed.  Right Coronary Artery  Prox RCA lesion, 60% stenosed.  Mid RCA-1 lesion, 75% stenosed. The lesion is eccentric.  Mid RCA-2 lesion, 50% stenosed.  Left Heart   Aortic Valve The aortic valve is calcified. There is restricted aortic valve motion.    Coronary Diagrams   Diagnostic Diagram       Implants     No implant documentation for this case.  PACS Images   Show images for Peripheral Vascular Catheterization   Link to Procedure Log   Procedure Log    Hemo Data    Most  Recent Value  Fick Cardiac Output 5.04 L/min  Fick Cardiac Output Index 2.46 (L/min)/BSA  RA A Wave 16 mmHg  RA V Wave 14 mmHg  RA Mean 14 mmHg  RV Systolic Pressure 33 mmHg  RV Diastolic Pressure 2 mmHg  RV EDP 6 mmHg  PA Systolic Pressure 33 mmHg  PA Diastolic Pressure 21 mmHg  PA Mean 27 mmHg  PW A Wave 14 mmHg  PW V Wave 13 mmHg  PW Mean 13 mmHg  AO Systolic Pressure 129 mmHg  AO Diastolic Pressure 76 mmHg  AO Mean 98 mmHg  QP/QS 1  TPVR Index 10.99 HRUI  TSVR Index 39.86 HRUI  PVR SVR Ratio 0.17  TPVR/TSVR Ratio 0.28      STS Risk Calculator  Procedure    AVR  Risk of Mortality   1.9% Morbidity or Mortality  20.3% Prolonged LOS   7.8% Short LOS    30.8% Permanent Stroke   1.8% Prolonged Vent Support  11.6% DSW Infection    0.3% Renal Failure    7.2% Reoperation    8.2%   Procedure    AVR + CABG  Risk of Mortality   2.4% Morbidity or Mortality  22.4% Prolonged LOS   11.7% Short LOS    22.8% Permanent Stroke   2.0% Prolonged Vent Support  14.0% DSW Infection    0.5% Renal Failure    7.7% Reoperation    9.5%   Cardiac TAVR CT  TECHNIQUE: The patient was scanned on a Philips 256 scanner. A 120 kV retrospective scan was triggered in the descending thoracic aorta at 111 HU's. Gantry rotation speed was 270 msecs and collimation was .9 mm. 5 mg of iv Metoprolol were given. The 3D data set was reconstructed in 5% intervals of the R-R cycle. Systolic and diastolic phases were analyzed on a dedicated work station using MPR, MIP and VRT modes. The patient received 80 cc of contrast.  FINDINGS: Aortic Valve: Trileaflet, severely calcified and thickened with severely restricted leaflet opening. There are moderate calcifications extending into the LVOT predominantly under the non-coronary and right cusps.  Aorta: Normal size, no calcifications, minimal atherosclerotic plaque.  Sinotubular Junction:  31 x 30 mm  Ascending Thoracic Aorta:  38 x 38  mm  Aortic Arch:  31 x 29 mm  Descending Thoracic Aorta:  28 x 28 mm  Sinus of Valsalva Measurements:  Non-coronary:  35 mm  Right -coronary:  35  mm  Left -coronary:  36 mm  Coronary Artery Height above Annulus:  Left Main:  12 mm  Right Coronary:  18 mm  Virtual Basal Annulus Measurements:  Maximum/Minimum Diameter:  28 x 22 mm  Perimeter:  82 mm  Area:  506 mm2  Optimum Fluoroscopic Angle for Delivery:  LAO 18 CAU 13  IMPRESSION: 1. Trileaflet, severely calcified and thickened aortic valve with severely restricted leaflet opening. There are moderate calcifications extending into the LVOT predominantly under the non-coronary and right cusps. Annular measurements are suitable for delivery of a 26 mm Edwards-SAPIEN 3 valve.  2.  Sufficient annulus to coronary distance.  3. Optimum Fluoroscopic Angle for Delivery:  LAO 18 CAU 13  4.  No thrombus in a large left atrial appendage.  5. Dilated pulmonary artery measuring 34 x 31 mm suspicious for pulmonary hypertension.  Tobias Alexander   Electronically Signed   By: Tobias Alexander   On: 05/21/2017 19:00   OVER-READ INTERPRETATION  CT CHEST  The following report is an over-read performed by radiologist Dr. Royal Piedra [facility] on 05/21/2017. This over-read does not include interpretation of cardiac or coronary anatomy or pathology. The coronary calcium score/coronary CTA interpretation by the cardiologist is attached.  COMPARISON:  None.  FINDINGS: Aortic atherosclerosis. Prominent superior pericardial recess (normal anatomical variant) incidentally noted. Within the visualized portions of the thorax there are no suspicious appearing pulmonary nodules or masses, there is no acute consolidative airspace disease, no pleural effusions, no pneumothorax and no lymphadenopathy. Visualized portions of the upper abdomen are unremarkable. There are no aggressive  appearing lytic or blastic lesions noted in the visualized portions of the skeleton.  IMPRESSION: 1. Aortic atherosclerosis.  Electronically Signed: By: Trudie Reed M.D. On: 05/21/2017 12:50     Impression:  Patient has stage D severe symptomatic aortic stenosis and multivessel coronary artery disease.  He presents with a slow gradual progression of symptoms of exertional shortness of breath consistent with chronic diastolic congestive heart failure, New York Heart Association functional class II. I have personally reviewed the patient's recent transthoracic echocardiogram, diagnostic cardiac catheterization and CT angiogram.  Echocardiogram confirms the presence of severe aortic stenosis which is approaching critical. The aortic valve appears to be trileaflet with extremely severe thickening, calcification, and restricted leaflet mobility involving all 3 leaflets. Peak velocity across the aortic valve measured 5.3 m/s corresponding to mean transvalvular gradient estimated 64 mmHg. Left ventricular systolic function remains normal. There is significant left ventricular hypertrophy with diastolic dysfunction.  Diagnostic cardiac catheterization was notable for the presence of multivessel coronary artery disease with high grade 90% ostial stenosis of a large obtuse marginal branch of the left circumflex coronary artery and 90% stenosis of the mid left circumflex coronary artery. There is 50% proximal stenosis of the right coronary artery and  75% stenosis of the mid right coronary artery.  Right heart pressures were normal. Cardiac gated CT angiogram of the heart confirmed anatomical findings consistent with severe aortic stenosis. There was no significant aneurysmal enlargement of the aortic root or proximal ascending thoracic aorta. Similarly, there was no significant calcification or plaque in the ascending thoracic aorta which might increase risks associated with conventional surgery. I  agree the patient needs aortic valve replacement. Risks associated with conventional surgery would be anticipated to be relatively low even taking into account the need for concomitant coronary artery bypass grafting.    Plan:  The patient was counseled at length regarding treatment alternatives for management  of severe symptomatic aortic stenosis and coronary artery disease. Alternative approaches such as conventional aortic valve replacement, transcatheter aortic valve replacement, and continued medical therapy were compared and contrasted at length.  The risks associated with conventional surgical aortic valve replacement were been discussed in detail, as were expectations for post-operative convalescence.  Issues specific to transcatheter aortic valve replacement were discussed including questions about long term valve durability, the potential for paravalvular leak, possible increased risk of need for permanent pacemaker placement, and other technical complications related to the procedure itself.  Reasons why the patient would not be considered candidate for transcatheter aortic valve replacement outside of participation in some type of a research trial designed for patients with anticipated low risks associated with conventional surgery were discussed.  Long-term prognosis with medical therapy was discussed. This discussion was placed in the context of the patient's own specific clinical presentation and past medical history.  All of this questions have been addressed.  The patient expresses complete understanding and seems inclined to be open to considering conventional surgery in the near future. However, he is scheduled for routine follow-up appointment with his doctor at the [facility] later this week.  He wants to discuss matters further at that appointment. He will contact our office in the future if he is interested in scheduling surgery. He has been reminded to maintain close  contact with his physician at the [facility] and with Dr. Algie Coffer locally in [place].   I spent in excess of 90 minutes during the conduct of this office consultation and >50% of this time involved direct face-to-face encounter with the patient for counseling and/or coordination of their care.     Salvatore Decent. Cornelius Moras, MD 06/02/2017 3:55 PM

## 2018-01-19 ENCOUNTER — Telehealth: Payer: Self-pay | Admitting: Cardiovascular Disease

## 2018-01-19 NOTE — Telephone Encounter (Signed)
Left message to call back  

## 2018-01-19 NOTE — Telephone Encounter (Signed)
Patient calling, requesting a call back to go over some information.

## 2018-01-24 NOTE — Telephone Encounter (Signed)
Thank you. Notes reviewed and pt is on the low-risk database for TAVR.

## 2018-01-24 NOTE — Telephone Encounter (Signed)
Spoke with patient who called to ask if TAVR has been approved for patients like him yet. I advised that I have spoken with Julieta GuttingLauren Brown, RN, TAVR Coordinator, who advised that he would be a traditional surgery candidate and that TAVR is still not approved for his case. I advised that Christopher ShamesLauren is keeping his name in her database in the event that the regulations change in the future. I asked about his symptoms and he states he is doing well. I advised him to call back if symptoms worsen. He verbalized understanding and agreement and thanked me for the call.

## 2018-01-24 NOTE — Telephone Encounter (Signed)
Christopher Sawyer is calling to find out if he can get the TAVR procedure done . Please call

## 2018-04-20 ENCOUNTER — Emergency Department (HOSPITAL_COMMUNITY)
Admission: EM | Admit: 2018-04-20 | Discharge: 2018-04-20 | Disposition: A | Discharge status: Home / Self Care | Payer: Medicare Other | Attending: Emergency Medicine | Admitting: Emergency Medicine

## 2018-04-20 ENCOUNTER — Emergency Department (HOSPITAL_COMMUNITY): Payer: Medicare Other

## 2018-04-20 ENCOUNTER — Encounter (HOSPITAL_COMMUNITY): Payer: Self-pay

## 2018-04-20 ENCOUNTER — Other Ambulatory Visit: Payer: Self-pay

## 2018-04-20 DIAGNOSIS — Y929 Unspecified place or not applicable: Secondary | ICD-10-CM | POA: Insufficient documentation

## 2018-04-20 DIAGNOSIS — R059 Cough, unspecified: Secondary | ICD-10-CM

## 2018-04-20 DIAGNOSIS — Y939 Activity, unspecified: Secondary | ICD-10-CM | POA: Insufficient documentation

## 2018-04-20 DIAGNOSIS — W01190A Fall on same level from slipping, tripping and stumbling with subsequent striking against furniture, initial encounter: Secondary | ICD-10-CM | POA: Diagnosis not present

## 2018-04-20 DIAGNOSIS — Z79899 Other long term (current) drug therapy: Secondary | ICD-10-CM | POA: Diagnosis not present

## 2018-04-20 DIAGNOSIS — E119 Type 2 diabetes mellitus without complications: Secondary | ICD-10-CM | POA: Insufficient documentation

## 2018-04-20 DIAGNOSIS — I2510 Atherosclerotic heart disease of native coronary artery without angina pectoris: Secondary | ICD-10-CM | POA: Insufficient documentation

## 2018-04-20 DIAGNOSIS — Z7984 Long term (current) use of oral hypoglycemic drugs: Secondary | ICD-10-CM | POA: Insufficient documentation

## 2018-04-20 DIAGNOSIS — R05 Cough: Secondary | ICD-10-CM | POA: Insufficient documentation

## 2018-04-20 DIAGNOSIS — Y999 Unspecified external cause status: Secondary | ICD-10-CM | POA: Insufficient documentation

## 2018-04-20 DIAGNOSIS — Z87891 Personal history of nicotine dependence: Secondary | ICD-10-CM | POA: Diagnosis not present

## 2018-04-20 DIAGNOSIS — I10 Essential (primary) hypertension: Secondary | ICD-10-CM | POA: Insufficient documentation

## 2018-04-20 DIAGNOSIS — S01511A Laceration without foreign body of lip, initial encounter: Secondary | ICD-10-CM

## 2018-04-20 DIAGNOSIS — Z7982 Long term (current) use of aspirin: Secondary | ICD-10-CM | POA: Insufficient documentation

## 2018-04-20 MED ORDER — ALBUTEROL SULFATE HFA 108 (90 BASE) MCG/ACT IN AERS: 1.0000 | INHALATION_SPRAY | Freq: Four times a day (QID) | RESPIRATORY_TRACT | 1 refills | Status: AC | PRN

## 2018-04-20 MED ORDER — IPRATROPIUM-ALBUTEROL 0.5-2.5 (3) MG/3ML IN SOLN
3.0000 mL | Freq: Once | RESPIRATORY_TRACT | Status: AC
  Administered 2018-04-20: 3 mL via RESPIRATORY_TRACT
  Filled 2018-04-20: qty 3

## 2018-04-20 NOTE — ED Triage Notes (Signed)
Pt presents for evaluation of mechanical fall today with 1 inch L upper lip laceration. Denies syncope. EMS evaluated at home and sent POV.

## 2018-04-20 NOTE — ED Provider Notes (Signed)
[facility] EMERGENCY DEPARTMENT Provider Note   CSN: [id number] Arrival date & time: 04/20/18  1427     History   Chief Complaint Chief Complaint  Patient presents with  . Fall  . Lip Laceration    HPI Christopher Sawyer is a 77 y.o. male.  HPI   77 year old male presents today status post fall.  Patient reports he tripped today hitting his left upper lip on a counter.  He denies any loss of consciousness, denies any neurological deficits, neck pain, facial pain, loose or missing dentition or decreased range of motion of the jaw.  Patient denies any other injuries from this.  He is not on blood thinners.  Patient does note that over the last 4-5 days he has had a cough.  He denies any acute shortness of breath, fever, chest pain.  Patient denies any smoking history.  Past Medical History:  Diagnosis Date  . Coronary artery disease involving native coronary artery of native heart without angina pectoris   . Hypertension   . Severe aortic stenosis   . Type 2 diabetes mellitus [facility])     Patient Active Problem List   Diagnosis Date Noted  . Coronary artery disease involving native coronary artery of native heart without angina pectoris 05/28/2017  . Severe aortic stenosis   . Hypertension   . Type 2 diabetes mellitus (HCC)     Past Surgical History:  Procedure Laterality Date  . AORTIC ARCH ANGIOGRAPHY N/A 05/28/2017   Procedure: Aortic Arch Angiography;  Surgeon: Tonny Bollman, MD;  Location: [facility] INVASIVE CV LAB;  Service: Cardiovascular;  Laterality: N/A;        Home Medications    Prior to Admission medications   Medication Sig Start Date End Date Taking? Authorizing Provider  amLODipine-benazepril (LOTREL) 10-40 MG capsule Take 1 capsule by mouth daily. 04/20/14  Yes [provider]  aspirin 81 MG EC tablet Take 81 mg by mouth daily.   Yes [provider]  metFORMIN (GLUCOPHAGE) 1000 MG tablet Take 500 mg by mouth 2 (two) times  daily.  11/28/13  Yes [provider]  Multiple Vitamin (MULTIVITAMIN WITH MINERALS) TABS tablet Take 1 tablet by mouth daily.   Yes [provider]  albuterol (PROVENTIL HFA;VENTOLIN HFA) 108 (90 Base) MCG/ACT inhaler Inhale 1-2 puffs into the lungs every 6 (six) hours as needed for wheezing or shortness of breath. 04/20/18   Eyvonne Mechanic, PA-C    Family History Family History  Problem Relation Age of Onset  . Other Mother        Heart valve replacement  . Other Father        Pacemaker    Social History Social History   Tobacco Use  . Smoking status: Former Games developer  . Smokeless tobacco: Never Used  Substance Use Topics  . Alcohol use: Not on file  . Drug use: Not on file     Allergies   Penicillins   Review of Systems Review of Systems  All other systems reviewed and are negative.  Physical Exam Updated Vital Signs BP 133/75 (BP Location: Right Arm)   Pulse 71   Temp 98.8 F (37.1 C) (Oral)   Resp 16   SpO2 100%   Physical Exam  Constitutional: He is oriented to person, place, and time. He appears well-developed and well-nourished.  HENT:  Head: Normocephalic.  0.5 cm laceration noted to the left upper lip, this is superficial, no mucosal involvement-facial bones nontender to palpation, dentition  within normal limits with no laxity, jaw full active pain-free range of motion  Eyes: Pupils are equal, round, and reactive to light. Conjunctivae are normal. Right eye exhibits no discharge. Left eye exhibits no discharge. No scleral icterus.  Neck: Normal range of motion. No JVD present. No tracheal deviation present.  Pulmonary/Chest: Effort normal. No stridor. No respiratory distress. He has wheezes. He has no rales. He exhibits no tenderness.  Minor bilateral end expiratory wheeze  Musculoskeletal:  No CT or L-spine tenderness to palpation neck supple full active range of motion and pain-free  Neurological: He is alert and oriented to person,  place, and time. No cranial nerve deficit or sensory deficit. He exhibits normal muscle tone. Coordination normal.  Psychiatric: He has a normal mood and affect. His behavior is normal. Judgment and thought content normal.  Nursing note and vitals reviewed.   ED Treatments / Results  Labs (all labs ordered are listed, but only abnormal results are displayed) Labs Reviewed - No data to display  EKG None  Radiology Dg Chest 2 View  Result Date: 04/20/2018 CLINICAL DATA:  Initial evaluation for acute trauma, fall, cough. EXAM: CHEST - 2 VIEW COMPARISON:  Prior CT from 05/21/2017. FINDINGS: Mild cardiomegaly.  Mediastinal silhouette normal. Lungs normally inflated. Streaky linear opacities at the left lung base most consistent with atelectasis. No focal infiltrates. No pulmonary edema or pleural effusion. No pneumothorax. No acute osseus abnormality. IMPRESSION: 1. Mild left basilar atelectasis. 2. No other active cardiopulmonary disease. Electronically Signed   By: Rise MuBenjamin  McClintock M.D.   On: 04/20/2018 16:49    Procedures Procedures (including critical care time)  Medications Ordered in ED Medications  ipratropium-albuterol (DUONEB) 0.5-2.5 (3) MG/3ML nebulizer solution 3 mL (3 mLs Nebulization Given 04/20/18 1538)     Initial Impression / Assessment and Plan / ED Course  I have reviewed the triage vital signs and the nursing notes.  Pertinent labs & imaging results that were available during my care of the patient were reviewed by me and considered in my medical decision making (see chart for details).     Final Clinical Impressions(s) / ED Diagnoses   Final diagnoses:  Lip laceration, initial encounter  Cough    Labs:   Imaging: DG Chest 2 view  Consults:  Therapeutics: DuoNeb  Discharge Meds: Albuterol  Assessment/Plan:  [age] YOM presents status post fall.  This was very minimal injury with a small laceration that did not require repair.  Patient has update  tetanus status.  He has no neck pain head pain, loss of consciousness or neurological deficits no indication for head imaging.  Patient did have cough while here minor wheeze improved with breathing treatment.  No signs of pneumonia or significant illness.  Patient discharged with albuterol, outpatient follow-up and strict return precautions.  Patient verbalized understanding and agreement to today's plan had no further questions or concerns.   ED Discharge Orders        Ordered    albuterol (PROVENTIL HFA;VENTOLIN HFA) 108 (90 Base) MCG/ACT inhaler  Every 6 hours PRN     04/20/18 1710       Raef Sprigg, Josie DixonJeffrey, PA-C 04/20/18 1801    Benjiman CorePickering, Nathan, MD 04/20/18 2140

## 2018-04-20 NOTE — Discharge Instructions (Addendum)
Please read attached information. If you experience any new or worsening signs or symptoms please return to the emergency room for evaluation. Please follow-up with your primary care provider or specialist as discussed. Please use medication prescribed only as directed and discontinue taking if you have any concerning signs or symptoms.   °

## 2018-05-28 DEATH — deceased

## 2018-07-13 IMAGING — CT CT HEART MORP W/ CTA COR W/ SCORE W/ CA W/CM &/OR W/O CM
1 of 5 series · 14 of 20 positions shown, 18 images · non-contrast
Comparison: None.

CLINICAL DATA: 76-year-old male with severe aortic stenosis being
evaluated for TAVR.

EXAM:
Cardiac TAVR CT
TECHNIQUE: The patient was scanned on a Philips 256 scanner. A 120 kV
retrospective scan was triggered in the descending thoracic aorta at
111 HU's. Gantry rotation speed was 270 msecs and collimation was .9
mm. 5 mg of iv Metoprolol were given. The 3D data set was
reconstructed in 5% intervals of the R-R cycle. Systolic and
diastolic phases were analyzed on a dedicated work station using
MPR, MIP and VRT modes. The patient received 80 cc of contrast.

[Series 7: corcta 0.6 b26f 0 - 100 % · axial · 0.43mm/px · z∈[+970,+1141]mm · 14 of 7260 slices shown, 18 images]
[im 484/7260  vessel]
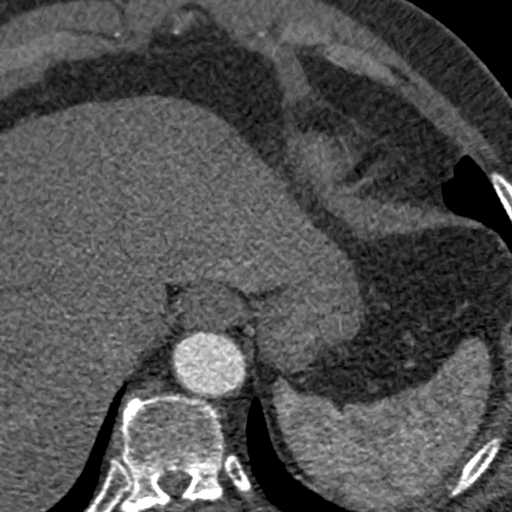
[im 484/7260  lung]
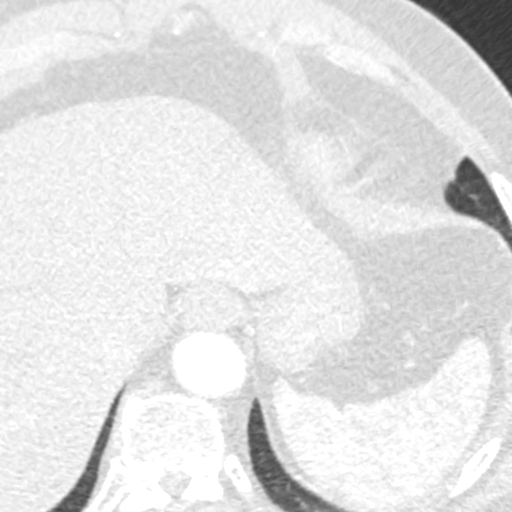
[im 968/7260  vessel]
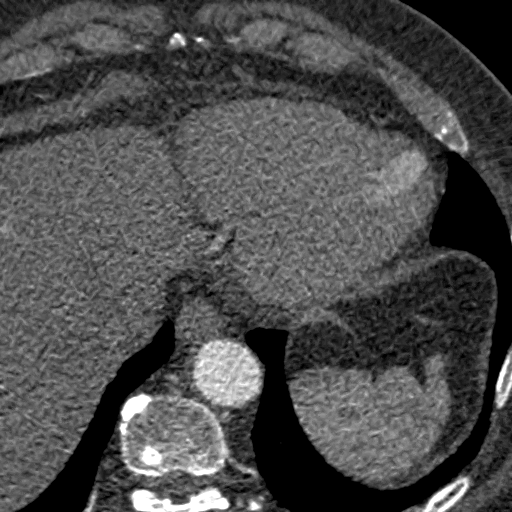
[im 1452/7260  vessel]
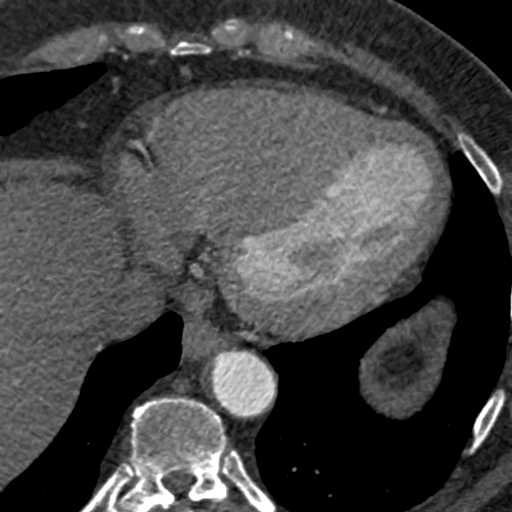
[im 1936/7260  vessel]
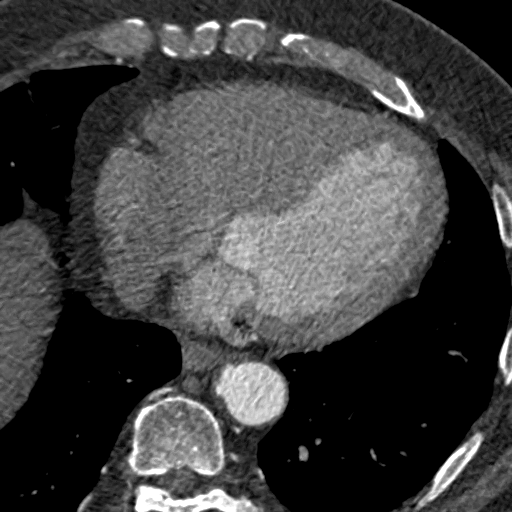
[im 2420/7260  vessel]
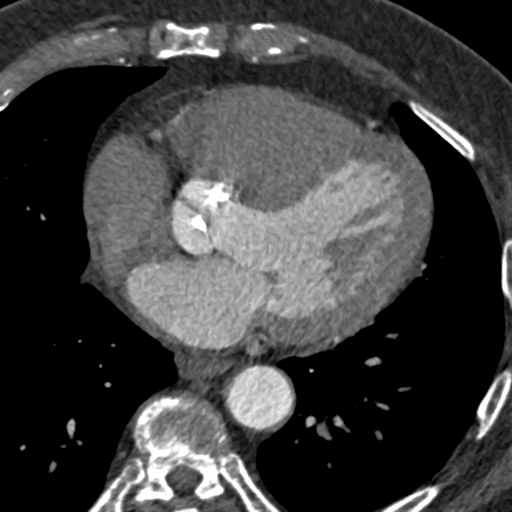
[im 2420/7260  lung]
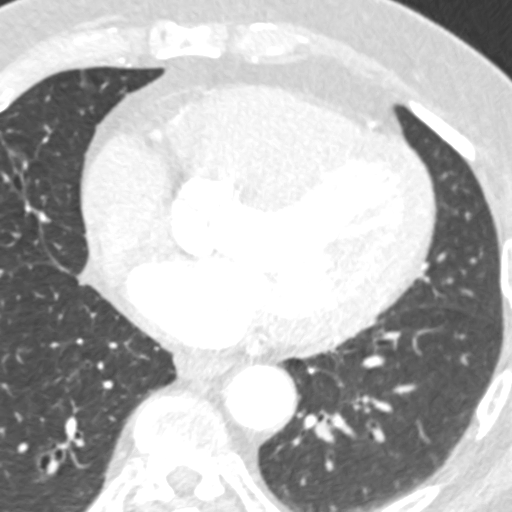
[im 2904/7260  vessel]
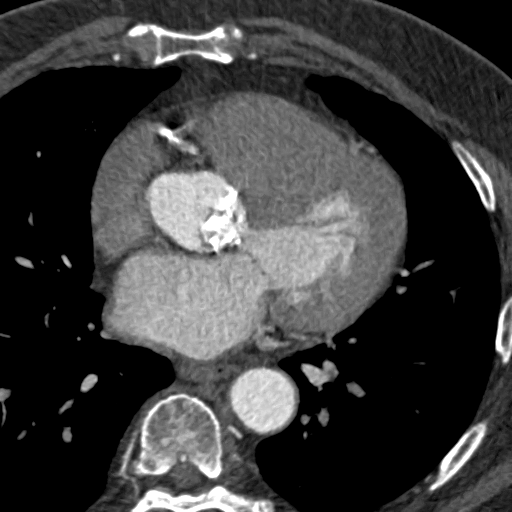
[im 3388/7260  vessel]
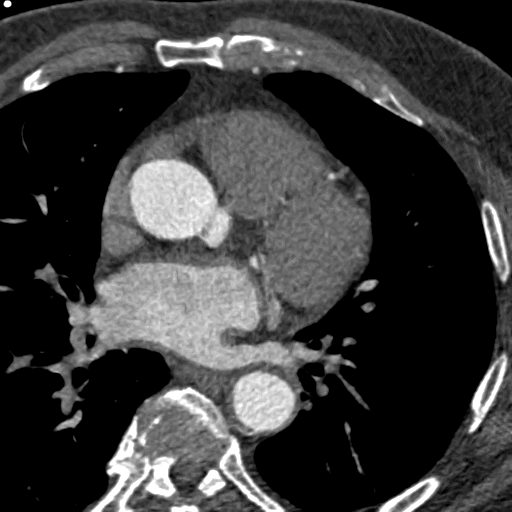
[im 3872/7260  vessel]
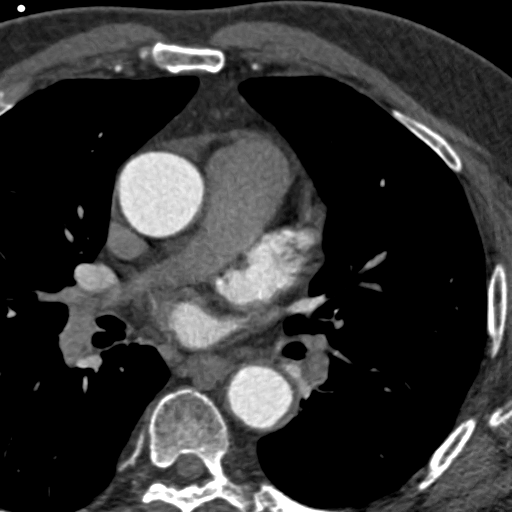
[im 4356/7260  vessel]
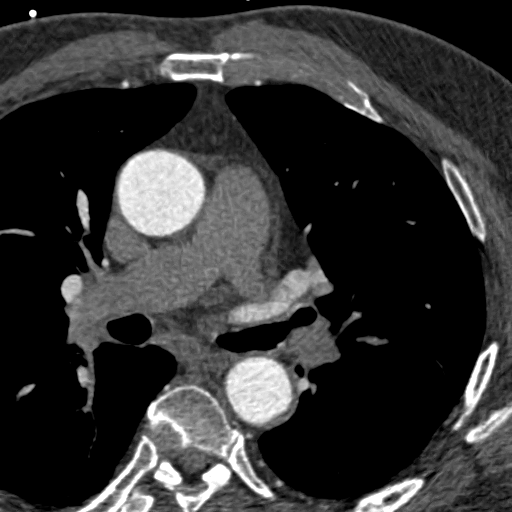
[im 4356/7260  lung]
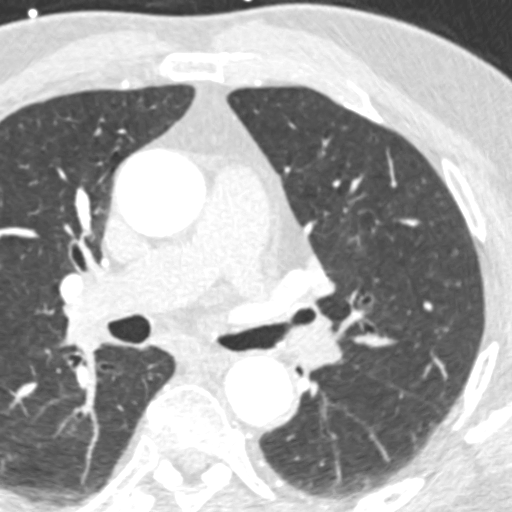
[im 4840/7260  vessel]
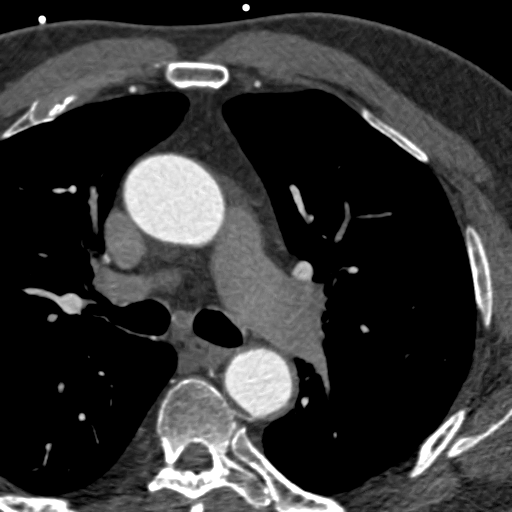
[im 5324/7260  vessel]
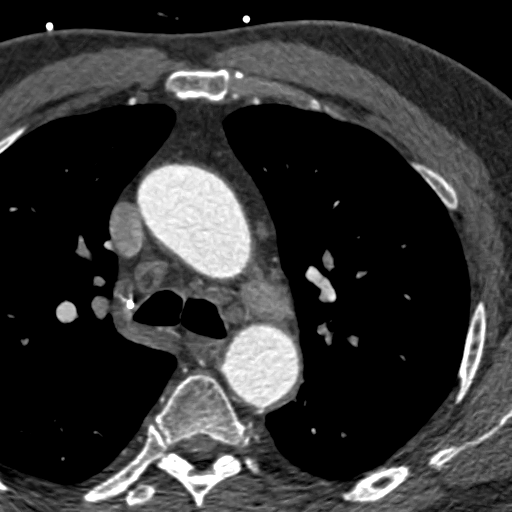
[im 5808/7260  vessel]
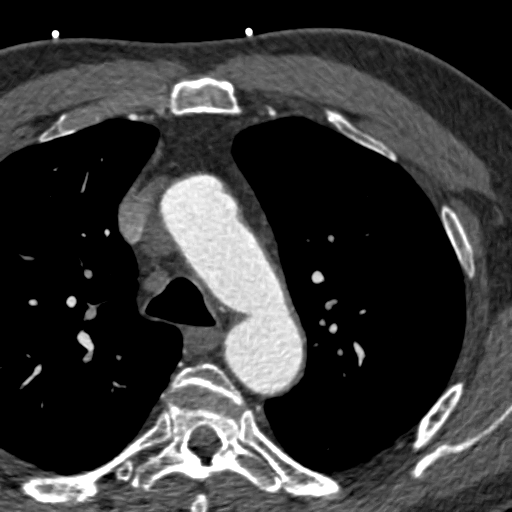
[im 6292/7260  vessel]
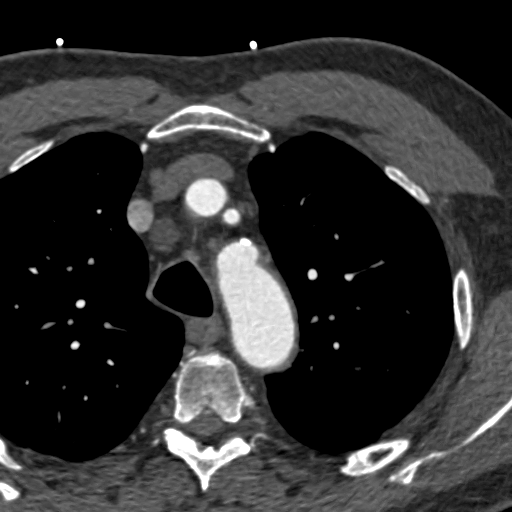
[im 6292/7260  lung]
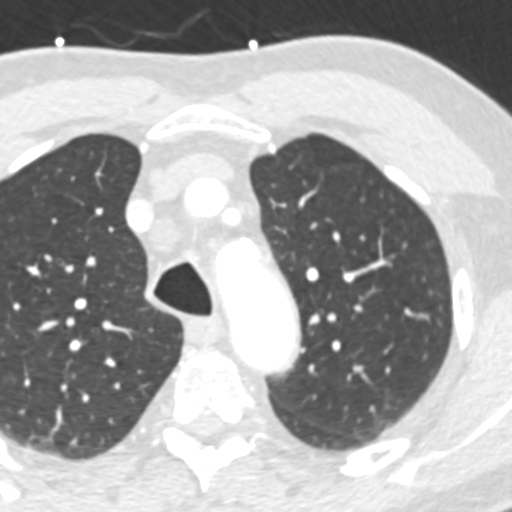
[im 6776/7260  vessel]
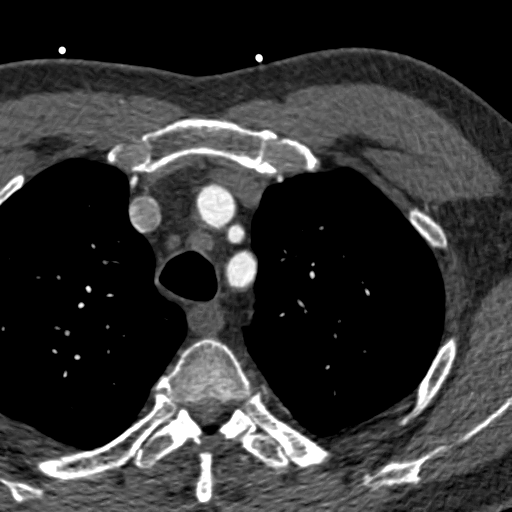

[14 of 20 positions shown; findings below may reference images not displayed]

FINDINGS: Aortic Valve: Trileaflet, severely calcified and thickened with
severely restricted leaflet opening. There are moderate
calcifications extending into the LVOT predominantly under the
non-coronary and right cusps.

Aorta: Normal size, no calcifications, minimal atherosclerotic
plaque.

Sinotubular Junction:  31 x 30 mm

Ascending Thoracic Aorta:  38 x 38 mm

Aortic Arch:  31 x 29 mm

Descending Thoracic Aorta:  28 x 28 mm

Sinus of Valsalva Measurements:

Non-coronary:  35 mm

Right -coronary:  35 mm

Left -coronary:  36 mm

Coronary Artery Height above Annulus:

Left Main:  12 mm

Right Coronary:  18 mm

Virtual Basal Annulus Measurements:

Maximum/Minimum Diameter:  28 x 22 mm

Perimeter:  82 mm

Area:  506 mm2

Optimum Fluoroscopic Angle for Delivery:  LAO 18 BASU 13
IMPRESSION: 1. Trileaflet, severely calcified and thickened aortic valve with
severely restricted leaflet opening. There are moderate
calcifications extending into the LVOT predominantly under the
non-coronary and right cusps. Annular measurements are suitable for
delivery of a 26 mm Edwards-SAPIEN 3 valve.

2.  Sufficient annulus to coronary distance.

3. Optimum Fluoroscopic Angle for Delivery:  LAO 18 BASU 13

4.  No thrombus in a large left atrial appendage.

5. Dilated pulmonary artery measuring 34 x 31 mm suspicious for
pulmonary hypertension.

Benny Zermeno

EXAM:
OVER-READ INTERPRETATION  CT CHEST

The following report is an over-read performed by radiologist Dr.
over-read does not include interpretation of cardiac or coronary
anatomy or pathology. The coronary calcium score/coronary CTA
interpretation by the cardiologist is attached.
FINDINGS: Aortic atherosclerosis. Prominent superior pericardial recess
(normal anatomical variant) incidentally noted. Within the
visualized portions of the thorax there are no suspicious appearing
pulmonary nodules or masses, there is no acute consolidative
airspace disease, no pleural effusions, no pneumothorax and no
lymphadenopathy. Visualized portions of the upper abdomen are
unremarkable. There are no aggressive appearing lytic or blastic
lesions noted in the visualized portions of the skeleton.
IMPRESSION: 1. Aortic atherosclerosis.
